# Patient Record
Sex: Female | Born: 1950 | Race: White | Hispanic: No | Smoking: Never smoker
Health system: Southern US, Community
[De-identification: ages and names within clinical notes are randomized; demographics above are authoritative.]

## PROBLEM LIST (undated history)

## (undated) DIAGNOSIS — E039 Hypothyroidism, unspecified: Secondary | ICD-10-CM

## (undated) DIAGNOSIS — R112 Nausea with vomiting, unspecified: Secondary | ICD-10-CM

## (undated) DIAGNOSIS — Z923 Personal history of irradiation: Secondary | ICD-10-CM

## (undated) DIAGNOSIS — M810 Age-related osteoporosis without current pathological fracture: Secondary | ICD-10-CM

## (undated) DIAGNOSIS — C4491 Basal cell carcinoma of skin, unspecified: Secondary | ICD-10-CM

## (undated) DIAGNOSIS — C50919 Malignant neoplasm of unspecified site of unspecified female breast: Secondary | ICD-10-CM

## (undated) DIAGNOSIS — Z973 Presence of spectacles and contact lenses: Secondary | ICD-10-CM

## (undated) DIAGNOSIS — Z9889 Other specified postprocedural states: Secondary | ICD-10-CM

## (undated) DIAGNOSIS — M1990 Unspecified osteoarthritis, unspecified site: Secondary | ICD-10-CM

## (undated) HISTORY — DX: Unspecified osteoarthritis, unspecified site: M19.90

## (undated) HISTORY — PX: TONSILLECTOMY: SUR1361

## (undated) HISTORY — DX: Hypothyroidism, unspecified: E03.9

## (undated) HISTORY — DX: Age-related osteoporosis without current pathological fracture: M81.0

## (undated) HISTORY — DX: Basal cell carcinoma of skin, unspecified: C44.91

## (undated) HISTORY — DX: Malignant neoplasm of unspecified site of unspecified female breast: C50.919

## (undated) HISTORY — PX: COLONOSCOPY: SHX174

---

## 1996-02-07 HISTORY — PX: ABDOMINAL HYSTERECTOMY: SHX81

## 2000-02-07 HISTORY — PX: APPENDECTOMY: SHX54

## 2000-05-29 ENCOUNTER — Encounter (INDEPENDENT_AMBULATORY_CARE_PROVIDER_SITE_OTHER): Payer: Self-pay | Admitting: Specialist

## 2000-05-29 ENCOUNTER — Observation Stay (HOSPITAL_COMMUNITY): Admission: EM | Admit: 2000-05-29 | Discharge: 2000-05-30 | Payer: Self-pay

## 2001-10-01 ENCOUNTER — Other Ambulatory Visit: Admission: RE | Admit: 2001-10-01 | Discharge: 2001-10-01 | Payer: Self-pay | Admitting: Internal Medicine

## 2005-10-18 ENCOUNTER — Encounter: Admission: RE | Admit: 2005-10-18 | Discharge: 2005-10-18 | Payer: Self-pay | Admitting: Internal Medicine

## 2006-10-22 ENCOUNTER — Encounter: Admission: RE | Admit: 2006-10-22 | Discharge: 2006-10-22 | Payer: Self-pay | Admitting: Internal Medicine

## 2007-10-28 ENCOUNTER — Encounter: Admission: RE | Admit: 2007-10-28 | Discharge: 2007-10-28 | Payer: Self-pay | Admitting: Internal Medicine

## 2008-10-28 ENCOUNTER — Encounter: Admission: RE | Admit: 2008-10-28 | Discharge: 2008-10-28 | Payer: Self-pay | Admitting: Internal Medicine

## 2009-10-29 ENCOUNTER — Encounter: Admission: RE | Admit: 2009-10-29 | Discharge: 2009-10-29 | Payer: Self-pay | Admitting: Internal Medicine

## 2010-06-24 NOTE — Op Note (Signed)
[facility]  Patient:    Ana Craig, Ana Craig                       MRN: [id number] Proc. Date: 05/29/00 Adm. Date:  [id number] Disc. Date: [id number] Attending:  Meredith Leeds                           Operative Report  PREOPERATIVE DIAGNOSIS:  Acute appendicitis.  POSTOPERATIVE DIAGNOSIS:  Acute appendicitis.  OPERATION PERFORMED:  Laparoscopic appendectomy.  SURGEON:  Lebron Conners, M.D.  ANESTHESIA:  General.  DESCRIPTION OF PROCEDURE:  After the patient had adequate monitoring and general anesthesia and routine preparation and draping of the abdomen, I made a short transverse infraumbilical incision after anesthetizing the area with long acting local anesthetic. I dissected down to the fascia, opened it longitudinally in the midline, opened the peritoneum bluntly and got into the abdomen easily and put my finger in to assure that there were no adhesions in the area. I put in a #0 Vicryl pursestring suture, secured a Hasson cannula, and inflated the abdomen with CO2. I could see some fluid in the pelvis and in the right lower quadrant when I put in the laparoscope. The gallbladder looked normal. The small bowel and cecum were normal. There were adhesions to the anterior midline of omentum and I took those down bluntly and with cautery cutting after the cautery. I then had good vision into the right lower quadrant. Placing the patient head down and tilted to the left and pulling up on the cecum with a Glassman retractor, I saw that there were adhesions. I took down those adhesions and then found the retrocecal appendix. I grasped it and elevated it and was able to see the mesoappendix. I put in a left lower quadrant 12 mm port and dissected away further adhesions and then stapled the mesoappendix with an endovascular stapler. I then stapled the base of the appendix with a similar stapler and there was good hemostasis and secure closure. I  removed the appendix from the body in a plastic pouch through the umbilical incision and then tied the pursestring suture. I irrigated out a small amount of blood which was present in the area of dissection and irrigated out the pelvic fluid. Hemostasis again appeared to be good. Sponge, needle and instrument counts were correct. There were no evident injuries of the bowel or other areas. I removed the right upper quadrant port under direct vision and then released the CO2 and removed the left lower quadrant port. I closed the skin of all the incisions with intracuticular 4-0 Vicryl and Steri-Strips. The patient tolerated the operation well. DD:  05/29/00 TD:  05/30/00 Job: [id number]

## 2010-09-30 ENCOUNTER — Other Ambulatory Visit: Payer: Self-pay | Admitting: Internal Medicine

## 2010-09-30 DIAGNOSIS — Z1231 Encounter for screening mammogram for malignant neoplasm of breast: Secondary | ICD-10-CM

## 2010-10-31 ENCOUNTER — Ambulatory Visit
Admission: RE | Admit: 2010-10-31 | Discharge: 2010-10-31 | Disposition: A | Discharge status: Home / Self Care | Payer: 59 | Source: Ambulatory Visit | Attending: Internal Medicine | Admitting: Internal Medicine

## 2010-10-31 DIAGNOSIS — Z1231 Encounter for screening mammogram for malignant neoplasm of breast: Secondary | ICD-10-CM

## 2011-09-18 ENCOUNTER — Other Ambulatory Visit: Payer: Self-pay | Admitting: Internal Medicine

## 2011-09-18 DIAGNOSIS — Z1231 Encounter for screening mammogram for malignant neoplasm of breast: Secondary | ICD-10-CM

## 2011-11-01 ENCOUNTER — Ambulatory Visit
Admission: RE | Admit: 2011-11-01 | Discharge: 2011-11-01 | Disposition: A | Discharge status: Home / Self Care | Payer: 59 | Source: Ambulatory Visit | Attending: Internal Medicine | Admitting: Internal Medicine

## 2011-11-01 DIAGNOSIS — Z1231 Encounter for screening mammogram for malignant neoplasm of breast: Secondary | ICD-10-CM

## 2012-02-07 DIAGNOSIS — Z923 Personal history of irradiation: Secondary | ICD-10-CM

## 2012-02-07 HISTORY — PX: BREAST LUMPECTOMY: SHX2

## 2012-02-07 HISTORY — DX: Personal history of irradiation: Z92.3

## 2012-10-17 ENCOUNTER — Other Ambulatory Visit: Payer: Self-pay

## 2012-10-17 DIAGNOSIS — Z1231 Encounter for screening mammogram for malignant neoplasm of breast: Secondary | ICD-10-CM

## 2012-11-21 ENCOUNTER — Ambulatory Visit
Admission: RE | Admit: 2012-11-21 | Discharge: 2012-11-21 | Disposition: A | Discharge status: Home / Self Care | Payer: 59 | Source: Ambulatory Visit

## 2012-11-21 ENCOUNTER — Ambulatory Visit: Payer: 59

## 2012-11-21 DIAGNOSIS — Z1231 Encounter for screening mammogram for malignant neoplasm of breast: Secondary | ICD-10-CM

## 2012-11-27 ENCOUNTER — Other Ambulatory Visit: Payer: Self-pay | Admitting: Internal Medicine

## 2012-11-27 DIAGNOSIS — R928 Other abnormal and inconclusive findings on diagnostic imaging of breast: Secondary | ICD-10-CM

## 2012-12-12 ENCOUNTER — Ambulatory Visit
Admission: RE | Admit: 2012-12-12 | Discharge: 2012-12-12 | Disposition: A | Discharge status: Home / Self Care | Payer: 59 | Source: Ambulatory Visit | Attending: Internal Medicine | Admitting: Internal Medicine

## 2012-12-12 ENCOUNTER — Other Ambulatory Visit: Payer: Self-pay | Admitting: Internal Medicine

## 2012-12-12 DIAGNOSIS — R921 Mammographic calcification found on diagnostic imaging of breast: Secondary | ICD-10-CM

## 2012-12-12 DIAGNOSIS — R928 Other abnormal and inconclusive findings on diagnostic imaging of breast: Secondary | ICD-10-CM

## 2012-12-13 ENCOUNTER — Ambulatory Visit
Admission: RE | Admit: 2012-12-13 | Discharge: 2012-12-13 | Disposition: A | Discharge status: Home / Self Care | Payer: 59 | Source: Ambulatory Visit | Attending: Internal Medicine | Admitting: Internal Medicine

## 2012-12-13 ENCOUNTER — Other Ambulatory Visit: Payer: Self-pay | Admitting: Internal Medicine

## 2012-12-13 DIAGNOSIS — R921 Mammographic calcification found on diagnostic imaging of breast: Secondary | ICD-10-CM

## 2012-12-13 DIAGNOSIS — D0511 Intraductal carcinoma in situ of right breast: Secondary | ICD-10-CM

## 2012-12-13 HISTORY — PX: BREAST BIOPSY: SHX20

## 2012-12-16 ENCOUNTER — Other Ambulatory Visit: Payer: Self-pay | Admitting: *Deleted

## 2012-12-16 ENCOUNTER — Telehealth: Payer: Self-pay | Admitting: *Deleted

## 2012-12-16 DIAGNOSIS — C50811 Malignant neoplasm of overlapping sites of right female breast: Secondary | ICD-10-CM

## 2012-12-16 DIAGNOSIS — Z853 Personal history of malignant neoplasm of breast: Secondary | ICD-10-CM | POA: Insufficient documentation

## 2012-12-16 NOTE — Telephone Encounter (Signed)
Confirmed [facility] for 12/18/12 at 0800 .  Instructions and contact information given.

## 2012-12-18 ENCOUNTER — Encounter: Payer: Self-pay | Admitting: *Deleted

## 2012-12-18 ENCOUNTER — Ambulatory Visit
Admission: RE | Admit: 2012-12-18 | Discharge: 2012-12-18 | Disposition: A | Discharge status: Home / Self Care | Payer: 59 | Source: Ambulatory Visit | Attending: Radiation Oncology | Admitting: Radiation Oncology

## 2012-12-18 ENCOUNTER — Ambulatory Visit (HOSPITAL_BASED_OUTPATIENT_CLINIC_OR_DEPARTMENT_OTHER): Payer: 59 | Admitting: Oncology

## 2012-12-18 ENCOUNTER — Encounter: Payer: Self-pay | Admitting: Oncology

## 2012-12-18 ENCOUNTER — Ambulatory Visit (HOSPITAL_BASED_OUTPATIENT_CLINIC_OR_DEPARTMENT_OTHER): Payer: 59 | Admitting: General Surgery

## 2012-12-18 ENCOUNTER — Other Ambulatory Visit (HOSPITAL_BASED_OUTPATIENT_CLINIC_OR_DEPARTMENT_OTHER): Payer: 59 | Admitting: Lab

## 2012-12-18 ENCOUNTER — Ambulatory Visit: Payer: 59

## 2012-12-18 ENCOUNTER — Ambulatory Visit: Payer: 59 | Admitting: Physical Therapy

## 2012-12-18 VITALS — BP 123/78 | HR 91 | Temp 97.6°F | Resp 20 | Ht 67.0 in | Wt 126.6 lb

## 2012-12-18 DIAGNOSIS — C50811 Malignant neoplasm of overlapping sites of right female breast: Secondary | ICD-10-CM

## 2012-12-18 DIAGNOSIS — D0590 Unspecified type of carcinoma in situ of unspecified breast: Secondary | ICD-10-CM

## 2012-12-18 DIAGNOSIS — Z170 Estrogen receptor positive status [ER+]: Secondary | ICD-10-CM

## 2012-12-18 DIAGNOSIS — C50919 Malignant neoplasm of unspecified site of unspecified female breast: Secondary | ICD-10-CM

## 2012-12-18 DIAGNOSIS — M810 Age-related osteoporosis without current pathological fracture: Secondary | ICD-10-CM

## 2012-12-18 LAB — COMPREHENSIVE METABOLIC PANEL (CC13)
Albumin: 4.1 g/dL (ref 3.5–5.0)
Alkaline Phosphatase: 84 U/L (ref 40–150)
Anion Gap: 9 mEq/L (ref 3–11)
BUN: 14.4 mg/dL (ref 7.0–26.0)
CO2: 28 mEq/L (ref 22–29)
Glucose: 66 mg/dl — ABNORMAL LOW (ref 70–140)
Potassium: 4.3 mEq/L (ref 3.5–5.1)
Sodium: 142 mEq/L (ref 136–145)
Total Bilirubin: 1.12 mg/dL (ref 0.20–1.20)
Total Protein: 7.8 g/dL (ref 6.4–8.3)

## 2012-12-18 LAB — CBC WITH DIFFERENTIAL/PLATELET
Basophils Absolute: 0.1 10*3/uL (ref 0.0–0.1)
Eosinophils Absolute: 0.1 10*3/uL (ref 0.0–0.5)
HGB: 14 g/dL (ref 11.6–15.9)
LYMPH%: 23.8 % (ref 14.0–49.7)
MCV: 92 fL (ref 79.5–101.0)
MONO#: 0.5 10*3/uL (ref 0.1–0.9)
MONO%: 10 % (ref 0.0–14.0)
NEUT#: 3.3 10*3/uL (ref 1.5–6.5)
Platelets: 297 10*3/uL (ref 145–400)
RBC: 4.54 10*6/uL (ref 3.70–5.45)
WBC: 5.3 10*3/uL (ref 3.9–10.3)

## 2012-12-18 NOTE — Progress Notes (Signed)
Chief Complaint: New diagnosis of breast cancer  History:    Ana Craig is a 62 y.o. postmenopausal female referred by Dr. 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 She recently presented for a screening mamogram revealing a small area of calcifications and possible mass in the right breast..  Subsequent imaging included diagnostic mamogram showing a 5 mm area of grouped calcifications in the upper central portion of the right breast. 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 She is seen now in [facility] for initial treatment planning.  She has experienced no breast symptoms, specifically no lump, pain, nipple discharge or skin changes..  She does not have a personal history of any previous breast problems. Bilateral breast MRI and has been scheduled for later this week.  Findings at that time were the following:  Tumor size: 5mm cm  Tumor grade: 3  Estrogen Receptor: positive Progesterone Receptor: negative   Past Medical History  Diagnosis Date  . Breast cancer   . Osteoporosis   . Hypothyroidism   . Arthritis     Past Surgical History  Procedure Laterality Date  . Abdominal hysterectomy    . Appendectomy      Current Outpatient Prescriptions  Medication Sig Dispense Refill  . levothyroxine (SYNTHROID, LEVOTHROID) 75 MCG tablet 75 mcg daily.       No current facility-administered medications for this visit.    No family history on file.  History   Social History  . Marital Status: Married    Spouse Name: N/A    Number of Children: N/A  . Years of Education: N/A   Social History Main Topics  . Smoking status: Never Smoker   . Smokeless tobacco: Not on file  . Alcohol Use: No  . Drug Use: No  . Sexual Activity: Not on file   Other Topics Concern  . Not on file   Social History Narrative  . No narrative on file     Review of Systems Constitutional:  negative Respiratory: negative Cardiovascular: negative Gastrointestinal: negative, colonoscopy is up-to-date     Objective:  There were no vitals taken for this visit.  General: Alert, well-developed thin Caucasian female, in no distress Skin: Warm and dry without rash or infection. HEENT: No palpable masses or thyromegaly. Sclera nonicteric. Pupils equal round and reactive. Oropharynx clear. Breasts: healing biopsy site and minimal bruising in the upper central right breast. No palpable masses in either breast. No skin changes. No palpable axillary adenopathy. Lymph nodes: No cervical, supraclavicular, or inguinal nodes palpable. Lungs: Breath sounds clear and equal without increased work of breathing Cardiovascular: Regular rate and rhythm without murmur. No JVD or edema. Peripheral pulses intact. Abdomen: Nondistended. Soft and nontender. No masses palpable. No organomegaly. No palpable hernias. Extremities: No edema or joint swelling or deformity. No chronic venous stasis changes. Neurologic: Alert and fully oriented. Gait normal.   Laboratory data:  CBC:  Lab Results  Component Value Date   WBC 5.3 12/18/2012   RBC 4.54 12/18/2012   HGB 14.0 12/18/2012   HCT 41.8 12/18/2012   PLT 297 12/18/2012  ]  [facility] Labs:  Lab Results  Component Value Date   NA 142 12/18/2012   K 4.3 12/18/2012   CO2 28 12/18/2012   BUN 14.4 12/18/2012   CREATININE 1.0 12/18/2012   CALCIUM 10.1 12/18/2012   PROT 7.8 12/18/2012   BILITOT 1.12 12/18/2012   ALKPHOS 84 12/18/2012  AST 26 12/18/2012   ALT 25 12/18/2012     Assessment  62 y.o. female with a new diagnosis of cancer of the the right breast upper central.  Clinical 0, estrogen receptor positive. I discussed with the patient and her friend present today initial surgical treatment options. We discussed options of breast conservation with lumpectomy or total mastectomy and sentinal lymph node biopsy/dissection. Options for reconstruction  were discussed. After discussion they have elected to proceed with right partial mastectomy.  We discussed the indications and nature of the procedure, and expected recovery, in detail. Surgical risks including anesthetic complications, cardiorespiratory complications, bleeding, infection, wound healing complications, blood clots, lymphedema, local and distant recurrence and possible need for further surgery based on the final pathology was discussed and understood.  Chemotherapy, hormonal therapy and radiation therapy have been discussed. They have been provided with literature regarding the treatment of breast cancer.  All questions were answered. They understand and agree to proceed and we will go ahead with scheduling.  Plan right partial mastectomy, needle localized as an outpatient under general anesthesia. This is of course pending the results of her MRI scheduled for later this week.  Mariella Saa MD, FACS  12/18/2012, 10:19 AM

## 2012-12-18 NOTE — Progress Notes (Signed)
Yasira Engelson [id number] 12/12/1950 62 y.o. 12/18/2012 12:31 PM  CC Dr. Rene Paci Dr. Glenna Fellows Dr. Lurline Hare  REASON FOR CONSULTATION:  62 year old female with new diagnosis of right breast cancer (DCIS) patient is seen in the multidisciplinary breast clinic for discussion of treatment options.  STAGE:   Cancer of midline of female breast   Primary site: Breast (Right)   Staging method: AJCC 7th Edition   Clinical: Stage 0 (Tis (DCIS), N0, cM0)   Summary: Stage 0 (Tis (DCIS), N0, cM0)  REFERRING PHYSICIAN: Dr. Sharlet Salina Hoxworth  HISTORY OF PRESENT ILLNESS:  Tosca Pletz is a 62 y.o. female.  Would medical history significant for osteoporosis and hypothyroidism and arthritis. Patient underwent bilateral screening mammogram on 11/21/2012. The mammogram revealed calcifications and a possible mass. She was recommended further evaluation of the right breast. In the left breast no masses or malignant type calcifications are identified. On 12/12/2012 patient had a right diagnostic mammogram performed this did not show a definite nodule in the central aspect of the right breast on magnification views there was noted to be group of calcifications in the upper central portion of the breasts. These measure 5 mm in diameter. Because of this she was recommended ultrasound which showed normal appearing common dense fibroglandular tissue no mass or distortion or acoustic shadowing. 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 Tumor was ER positive PR negative. Her MRI is pending. Patient is without any complaints related to her breast. Her case was discussed at the multidisciplinary breast conference this morning and she is now seen in the multidisciplinary breast clinic for discussion of further treatment options. She was also seen by Dr. Hal Neer and Dr. Glenna Fellows.     Past Medical History: Past Medical History  Diagnosis Date  . Breast cancer   . Osteoporosis   . Hypothyroidism   . Arthritis     Past Surgical History: Past Surgical History  Procedure Laterality Date  . Abdominal hysterectomy    . Appendectomy      Family History: History reviewed. No pertinent family history.  Social History History  Substance Use Topics  . Smoking status: Never Smoker   . Smokeless tobacco: Not on file  . Alcohol Use: No    Allergies: Allergies  Allergen Reactions  . Penicillins Rash    Current Medications: Current Outpatient Prescriptions  Medication Sig Dispense Refill  . levothyroxine (SYNTHROID, LEVOTHROID) 75 MCG tablet 75 mcg daily.       No current facility-administered medications for this visit.    OB/GYN History: menarche at age 69 patient underwent menopause she did go on hormone replacement therapy she stopped in 1999 she is nulliparous area in she has had use of oral contraceptive pills.  Fertility Discussion:not applicable  Prior History of Cancer: no  Health Maintenance:  Colonoscopy yes  Bone Density yes Last PAP smear unknown   ECOG PERFORMANCE STATUS: 0 - Asymptomatic  Genetic Counseling/testing: no   REVIEW OF SYSTEMS:  A comprehensive review of systems was negative.  PHYSICAL EXAMINATION: Blood pressure 123/78, pulse 91, temperature 97.6 F (36.4 C), temperature source Oral, resp. rate 20, height 5\' 7"  (1.702 m), weight 126 lb 9.6 oz (57.425 kg).  WUJ:WJXBJ, healthy, no distress, well nourished and well developed SKIN: skin color, texture, turgor are normal HEAD: Normocephalic EYES: normal, PERRLA, EOMI, Conjunctiva are pink and non-injected EARS: External ears normal OROPHARYNX:no exudate, no erythema and lips, buccal  mucosa, and tongue normal  NECK: supple, no adenopathy, thyroid normal size, non-tender, without nodularity LYMPH:  no palpable lymphadenopathy, no hepatosplenomegaly BREAST:left  breast normal without mass, skin or nipple changes or axillary nodes, area of ecchymosis is noted in the right breast with thickness of the biopsy site no other palpable masses nipple discharge or other skin changes  LUNGS: clear to auscultation and percussion HEART: regular rate & rhythm, no murmurs and no gallops ABDOMEN:abdomen soft, non-tender, normal bowel sounds and no masses or organomegaly BACK: Back symmetric, no curvature., No CVA tenderness, Range of motion is normal EXTREMITIES:no edema, no clubbing, no cyanosis  NEURO: alert & oriented x 3 with fluent speech, no focal motor/sensory deficits, gait normal, reflexes normal and symmetric     STUDIES/RESULTS: US Breast Right  12/12/2012   CLINICAL DATA:  The patient presents for further evaluation of right breast calcifications and a possible right breast mass.  EXAM: DIGITAL DIAGNOSTIC  right MAMMOGRAM  ULTRASOUND right BREAST  COMPARISON:  11/21/2012  ACR Breast Density Category c: The breasts are heterogeneously dense, which may obscure small masses.  FINDINGS: Additional views are performed, showing no definite nodule in the central aspect of the right breast on spot compression view. On magnified views, there is a grouping of calcifications in the upper central portion of the breast. On magnified views, this grouping measures approximately 5 mm in diameter. Calcifications vary in size, shape, and density. Biopsy is recommended to exclude ductal carcinoma in situ.  On physical exam I palpate no abnormality in the central aspect of the right breast.  Ultrasound is performed, showing normal appearing common dense fibroglandular tissue in the retroareolar region of the right breast. No mass, distortion, or acoustic shadowing is demonstrated with ultrasound.  IMPRESSION: 1. Suspicious microcalcifications in the upper central portion of the right breast. 2. No persistent mass on further evaluation.  RECOMMENDATION: Stereotactic guided core biopsy  is recommended for right breast calcifications. This will be performed later today at the request of the patient.  I have discussed the findings and recommendations with the patient. Results were also provided in writing at the conclusion of the visit. If applicable, a reminder letter will be sent to the patient regarding the next appointment.  BI-RADS CATEGORY  4: Suspicious abnormality - biopsy should be considered.   Electronically Signed   By: Rosalie Gums M.D.   On: 12/12/2012 11:49   Mm Digital Diag Ltd R  12/12/2012   CLINICAL DATA:  The patient presents for further evaluation of right breast calcifications and a possible right breast mass.  EXAM: DIGITAL DIAGNOSTIC  right MAMMOGRAM  ULTRASOUND right BREAST  COMPARISON:  11/21/2012  ACR Breast Density Category c: The breasts are heterogeneously dense, which may obscure small masses.  FINDINGS: Additional views are performed, showing no definite nodule in the central aspect of the right breast on spot compression view. On magnified views, there is a grouping of calcifications in the upper central portion of the breast. On magnified views, this grouping measures approximately 5 mm in diameter. Calcifications vary in size, shape, and density. Biopsy is recommended to exclude ductal carcinoma in situ.  On physical exam I palpate no abnormality in the central aspect of the right breast.  Ultrasound is performed, showing normal appearing common dense fibroglandular tissue in the retroareolar region of the right breast. No mass, distortion, or acoustic shadowing is demonstrated with ultrasound.  IMPRESSION: 1. Suspicious microcalcifications in the upper central portion of the right breast. 2. No  persistent mass on further evaluation.  RECOMMENDATION: Stereotactic guided core biopsy is recommended for right breast calcifications. This will be performed later today at the request of the patient.  I have discussed the findings and recommendations with the patient.  Results were also provided in writing at the conclusion of the visit. If applicable, a reminder letter will be sent to the patient regarding the next appointment.  BI-RADS CATEGORY  4: Suspicious abnormality - biopsy should be considered.   Electronically Signed   By: Rosalie Gums M.D.   On: 12/12/2012 11:49   Mm Digital Screening  11/21/2012   CLINICAL DATA:  Screening.  EXAM: DIGITAL SCREENING BILATERAL MAMMOGRAM WITH CAD  DIGITAL BREAST TOMOSYNTHESIS  Digital breast tomosynthesis images are acquired in two projections. These images are reviewed in combination with the digital mammogram, confirming the findings below.  COMPARISON:  Previous Exam(s)  ACR Breast Density Category c: The breasts are heterogeneously dense, which may obscure small masses.  FINDINGS: In the right breast, calcifications and a possible mass warrant further evaluation with magnified views. In the left breast, no mass or malignant type calcifications are identified. Images were processed with CAD.  IMPRESSION: Further evaluation is suggested for calcifications in the right breast.  RECOMMENDATION: Diagnostic mammogram and possibly ultrasound of the right breast. (Code:[id number]-R-65M)  The patient will be contacted regarding the findings, and additional imaging will be scheduled.  BI-RADS CATEGORY  0: Incomplete. Need additional imaging evaluation and/or prior mammograms for comparison.   Electronically Signed   By: Esperanza Heir M.D.   On: 11/21/2012 15:05   Mm Radiologist Eval And Mgmt  12/18/2012   CONSULTATION: HPI: Ms. Larina Bras returns for a followup after a right breast stereotactic biopsy 12/12/2012. The patient is doing well and denies any biopsy site complications.  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 This is concordant with the imaging findings.  Biopsy site: Clean, dry, and intact. Steri-Strips overlie small skin incision.  PLAN: All the patient's questions were answered. She  is aware of the results and demonstrates understanding. The patient is scheduled to follow-up in Multidisciplinary conference 12/18/2012 and a breast MRI is scheduled on 12/20/2012.   Electronically Signed   By: Edwin Cap M.D.   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 This is concordant with the imaging findings.  The patient is aware of the results. She denies any biopsy site complications.  Ms. Nedra Hai is scheduled to follow-up in Multidisciplinary conference on 12/18/2012 and a breast MRI is scheduled on 12/20/2012.   Electronically Signed   By: Edwin Cap M.D.   On: 12/13/2012 16:12   12/13/2012   CLINICAL DATA:  Screening. The patient presents for stereotactic guided core biopsy of calcifications in the upper central portion of the right breast.  EXAM: STEREOTACTIC VACUUM ASSIST RIGHT  COMPARISON:  11/21/2012, 12/12/2012  FINDINGS: I met with the patient and we discussed the procedure of stereotactic-guided biopsy, including benefits and alternatives. We discussed the high likelihood of a successful procedure. We discussed the risks of the procedure, including infection, bleeding, tissue injury, clip migration, and inadequate sampling. Informed, written consent was given. The usual time out protocol was performed immediately prior to the procedure.  Using sterile technique and 2% Lidocaine as local anesthetic, under stereotactic guidance, a 12  gauge vacuum assisted device was used to perform core needle biopsy of calcifications in the upper central portion of the right breast using a cephalad approach. Specimen radiograph was performed, showing calcifications to be present. Specimens with calcifications are identified for pathology.  At the conclusion of the procedure, a top hat shaped tissue marker clip  was deployed into the biopsy cavity. Follow-up 2-view mammogram confirmed clipin expected location.  IMPRESSION: Stereotactic-guided biopsy of right breast calcifications. No apparent complications.  Electronically Signed: By: Rosalie Gums M.D. On: 12/12/2012 16:37     LABS:    Chemistry      Component Value Date/Time   NA 142 12/18/2012 0827   K 4.3 12/18/2012 0827   CO2 28 12/18/2012 0827   BUN 14.4 12/18/2012 0827   CREATININE 1.0 12/18/2012 0827      Component Value Date/Time   CALCIUM 10.1 12/18/2012 0827   ALKPHOS 84 12/18/2012 0827   AST 26 12/18/2012 0827   ALT 25 12/18/2012 0827   BILITOT 1.12 12/18/2012 0827      Lab Results  Component Value Date   WBC 5.3 12/18/2012   HGB 14.0 12/18/2012   HCT 41.8 12/18/2012   MCV 92.0 12/18/2012   PLT 297 12/18/2012   PATHOLOGY: Diagnosis Breast, right, needle core biopsy, upper central right breast - DUCTAL CARCINOMA IN SITU WITH CALCIFICATIONS. - SEE COMMENT. Microscopic Comment The carcinoma is high grade. Estrogen receptor and progesterone receptor studies will be performed and the results reported separately. The results are called to [facility] on 12/13/12. 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 No evidence of any disease in the left breast. Patient is status post stereotactic biopsy. Is seen in the multidisciplinary breast clinic for discussion of treatment options.  #2 We spent the better part of today's hour-long appointment discussing the biology of breast cancer in general, and the specifics of the patient's tumor in particular. We discussed the pathophysiology of DCIS, a prognostic panel, we discussed  treatment options surgically for this patient she is a good breast conservation candidate with a lumpectomy. She most likely will not need sentinel lymph node biopsy. We discussed adjuvant radiation therapy. We also discussed adjuvant antiestrogen therapy for reduction of future breast cancer risk recurrence for DCIS and invasive disease. We discussed tamoxifen 20 mg daily. We discussed side effects and benefits of tamoxifen. We discussed length of therapy would be 5 years.   Clinical Trial Eligibility: no Multidisciplinary conference discussion yes     PLAN:    #1 patient will proceed with lumpectomy first.  #2 once patient completes her surgery I will see the patient will go over her final pathology. If she has no significant changes in overall pathology and she still has a DCIS I will refer her to Dr. Lurline Hare for consideration of radiation therapy adjuvantly area  #3 once patient completes radiation she will receive adjuvant tamoxifen 20 mg daily for a total of 5 years.  #4 patient will be seen back's after her surgery        Discussion: Patient is being treated per NCCN breast cancer care guidelines appropriate for stage.0   Thank you so much for allowing me to participate in the care of Ansel Bong.  I will continue to follow up the patient with you and assist in her care.  All questions were answered. The patient knows to call the clinic with any problems, questions or concerns. We can certainly see the patient much sooner if necessary.  I spent 40 minutes counseling the patient face to face. The total time spent in the appointment was 60 minutes.  Drue Second, MD Medical/Oncology [facility] [phone] (beeper) [phone] (Office)  12/18/2012, 12:31 PM

## 2012-12-18 NOTE — Progress Notes (Signed)
Checked in new patient with no financial issues. She has not been to [place] and she has her appt card. She has her [facility] packet also.

## 2012-12-18 NOTE — Progress Notes (Signed)
Radiation Oncology         [phone]) [phone] ________________________________  Initial outpatient Consultation - Date: 12/18/2012   Name: Ana Craig MRN: [id number]   DOB: 31-Oct-1950  REFERRING PHYSICIAN: Mariella Saa, MD  DIAGNOSIS: DCIS of the right breast  HISTORY OF PRESENT ILLNESS::Ana Craig is a 62 y.o. female  underwent a screening mammogram and was found to have calcifications on the right. She had no symptoms and no history of cancer. A 5 mm area of calcifications was noted. 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 This was ER positive at 15% PR negative. She has had a hysterectomy but her ovaries were left behind. She was on hormone replacement for about 5 years in 1990s. She is not on hormone replacement now. He is GX P 0. She had menses at 16. She is accompanied by her good friend today. 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 She would like to receive her radiation in [place]. He is interested in breast conservation. Her MRI is scheduled for Friday.  PREVIOUS RADIATION THERAPY: No  PAST MEDICAL HISTORY:  has a past medical history of Breast cancer; Osteoporosis; Hypothyroidism; and Arthritis.    PAST SURGICAL HISTORY: Past Surgical History  Procedure Laterality Date  . Abdominal hysterectomy    . Appendectomy      FAMILY HISTORY: No family history on file.  SOCIAL HISTORY:  History  Substance Use Topics  . Smoking status: Never Smoker   . Smokeless tobacco: Not on file  . Alcohol Use: No    ALLERGIES: Penicillins  MEDICATIONS:  Current Outpatient Prescriptions  Medication Sig Dispense Refill  . levothyroxine (SYNTHROID, LEVOTHROID) 75 MCG tablet 75 mcg daily.       No current facility-administered medications for this encounter.    REVIEW OF SYSTEMS:  A 15 point review of systems is documented in the electronic medical record. This was obtained by the nursing  staff. However, I reviewed this with the patient to discuss relevant findings and make appropriate changes.  Pertinent items are noted in HPI.  PHYSICAL EXAM:  She is a pleasant female in no distress sitting comfortably on examining table. She has minimal biopsy change in the upper outer quadrant of the right breast. No palpable abnormalities bilaterally. No palpable cervical or subclavicular adenopathy. No palpable axillary adenopathy bilaterally. She is alert and oriented x3. She has no lymphedema in her bilateral arms. She is 5 out of 5 strength bilaterally.  LABORATORY DATA:  Lab Results  Component Value Date   WBC 5.3 12/18/2012   HGB 14.0 12/18/2012   HCT 41.8 12/18/2012   MCV 92.0 12/18/2012   PLT 297 12/18/2012   Lab Results  Component Value Date   NA 142 12/18/2012   K 4.3 12/18/2012   CO2 28 12/18/2012   Lab Results  Component Value Date   ALT 25 12/18/2012   AST 26 12/18/2012   ALKPHOS 84 12/18/2012   BILITOT 1.12 12/18/2012     RADIOGRAPHY: US Breast Right  12/12/2012   CLINICAL DATA:  The patient presents for further evaluation of right breast calcifications and a possible right breast mass.  EXAM: DIGITAL DIAGNOSTIC  right MAMMOGRAM  ULTRASOUND right BREAST  COMPARISON:  11/21/2012  ACR Breast Density Category c: The breasts are heterogeneously dense, which may obscure small masses.  FINDINGS: Additional views are performed, showing no definite nodule in the central aspect of the right breast on spot compression view.  On magnified views, there is a grouping of calcifications in the upper central portion of the breast. On magnified views, this grouping measures approximately 5 mm in diameter. Calcifications vary in size, shape, and density. Biopsy is recommended to exclude ductal carcinoma in situ.  On physical exam I palpate no abnormality in the central aspect of the right breast.  Ultrasound is performed, showing normal appearing common dense fibroglandular tissue in the  retroareolar region of the right breast. No mass, distortion, or acoustic shadowing is demonstrated with ultrasound.  IMPRESSION: 1. Suspicious microcalcifications in the upper central portion of the right breast. 2. No persistent mass on further evaluation.  RECOMMENDATION: Stereotactic guided core biopsy is recommended for right breast calcifications. This will be performed later today at the request of the patient.  I have discussed the findings and recommendations with the patient. Results were also provided in writing at the conclusion of the visit. If applicable, a reminder letter will be sent to the patient regarding the next appointment.  BI-RADS CATEGORY  4: Suspicious abnormality - biopsy should be considered.   Electronically Signed   By: Rosalie Gums M.D.   On: 12/12/2012 11:49   Mm Digital Diag Ltd R  12/12/2012   CLINICAL DATA:  The patient presents for further evaluation of right breast calcifications and a possible right breast mass.  EXAM: DIGITAL DIAGNOSTIC  right MAMMOGRAM  ULTRASOUND right BREAST  COMPARISON:  11/21/2012  ACR Breast Density Category c: The breasts are heterogeneously dense, which may obscure small masses.  FINDINGS: Additional views are performed, showing no definite nodule in the central aspect of the right breast on spot compression view. On magnified views, there is a grouping of calcifications in the upper central portion of the breast. On magnified views, this grouping measures approximately 5 mm in diameter. Calcifications vary in size, shape, and density. Biopsy is recommended to exclude ductal carcinoma in situ.  On physical exam I palpate no abnormality in the central aspect of the right breast.  Ultrasound is performed, showing normal appearing common dense fibroglandular tissue in the retroareolar region of the right breast. No mass, distortion, or acoustic shadowing is demonstrated with ultrasound.  IMPRESSION: 1. Suspicious microcalcifications in the upper central  portion of the right breast. 2. No persistent mass on further evaluation.  RECOMMENDATION: Stereotactic guided core biopsy is recommended for right breast calcifications. This will be performed later today at the request of the patient.  I have discussed the findings and recommendations with the patient. Results were also provided in writing at the conclusion of the visit. If applicable, a reminder letter will be sent to the patient regarding the next appointment.  BI-RADS CATEGORY  4: Suspicious abnormality - biopsy should be considered.   Electronically Signed   By: Rosalie Gums M.D.   On: 12/12/2012 11:49   Mm Digital Screening  11/21/2012   CLINICAL DATA:  Screening.  EXAM: DIGITAL SCREENING BILATERAL MAMMOGRAM WITH CAD  DIGITAL BREAST TOMOSYNTHESIS  Digital breast tomosynthesis images are acquired in two projections. These images are reviewed in combination with the digital mammogram, confirming the findings below.  COMPARISON:  Previous Exam(s)  ACR Breast Density Category c: The breasts are heterogeneously dense, which may obscure small masses.  FINDINGS: In the right breast, calcifications and a possible mass warrant further evaluation with magnified views. In the left breast, no mass or malignant type calcifications are identified. Images were processed with CAD.  IMPRESSION: Further evaluation is suggested for calcifications in the right  breast.  RECOMMENDATION: Diagnostic mammogram and possibly ultrasound of the right breast. (Code:[id number])  The patient will be contacted regarding the findings, and additional imaging will be scheduled.  BI-RADS CATEGORY  0: Incomplete. Need additional imaging evaluation and/or prior mammograms for comparison.   Electronically Signed   By: Esperanza Heir M.D.   On: 11/21/2012 15:05   Mm Radiologist Eval And Mgmt  12/18/2012   CONSULTATION: HPI: Ms. Larina Bras returns for a followup after a right breast stereotactic biopsy 12/12/2012. The patient is doing well and  denies any biopsy site complications.  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 This is concordant with the imaging findings.  Biopsy site: Clean, dry, and intact. Steri-Strips overlie small skin incision.  PLAN: All the patient's questions were answered. She is aware of the results and demonstrates understanding. The patient is scheduled to follow-up in Multidisciplinary conference 12/18/2012 and a breast MRI is scheduled on 12/20/2012.   Electronically Signed   By: Edwin Cap M.D.   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 This is concordant with the imaging findings.  The patient is aware of the results. She denies any biopsy site complications.  Ms. Nedra Hai is scheduled to follow-up in Multidisciplinary conference on 12/18/2012 and a breast MRI is scheduled on 12/20/2012.   Electronically Signed   By: Edwin Cap M.D.   On: 12/13/2012 16:12   12/13/2012   CLINICAL DATA:  Screening. The patient presents for stereotactic guided core biopsy of calcifications in the upper central portion of the right breast.  EXAM: STEREOTACTIC VACUUM ASSIST RIGHT  COMPARISON:  11/21/2012, 12/12/2012  FINDINGS: I met with the patient and we discussed the procedure of stereotactic-guided biopsy, including benefits and alternatives. We discussed the high likelihood of a successful procedure. We discussed the risks of the procedure, including infection, bleeding, tissue injury, clip migration, and inadequate sampling. Informed, written consent was given. The usual time out protocol was performed immediately prior to the procedure.  Using sterile technique and 2% Lidocaine as local anesthetic, under stereotactic guidance, a 12 gauge vacuum assisted device was used to perform core  needle biopsy of calcifications in the upper central portion of the right breast using a cephalad approach. Specimen radiograph was performed, showing calcifications to be present. Specimens with calcifications are identified for pathology.  At the conclusion of the procedure, a top hat shaped tissue marker clip was deployed into the biopsy cavity. Follow-up 2-view mammogram confirmed clipin expected location.  IMPRESSION: Stereotactic-guided biopsy of right breast calcifications. No apparent complications.  Electronically Signed: By: Rosalie Gums M.D. On: 12/12/2012 16:37      IMPRESSION: DCIS of the right breast  PLAN: I discussed with Ms. Nedra Hai that her MRI is pending. This could provide more information which could change her surgical options. At this point however she is a good candidate for breast conservation. We discussed the role of radiation and decreasing local failures in patients who undergo lumpectomy. We discussed the process of simulation the placement tattoos. We discussed 4-6 weeks of treatment as an outpatient. She would like to meet with me after surgery and then receive her radiation in [facility]. This will facilitate treatment before or after work for her. We discussed skin redness and asymptomatic lung damage as possible side effects.  I  spent 40 minutes  face to face with the patient and more than 50% of that time was spent in counseling and/or coordination of care.   ------------------------------------------------  Lurline Hare, MD

## 2012-12-19 ENCOUNTER — Encounter: Payer: Self-pay | Admitting: *Deleted

## 2012-12-19 ENCOUNTER — Encounter (HOSPITAL_BASED_OUTPATIENT_CLINIC_OR_DEPARTMENT_OTHER): Payer: Self-pay | Admitting: *Deleted

## 2012-12-19 NOTE — Progress Notes (Signed)
No PCP - Could not fax a Care Plan.

## 2012-12-19 NOTE — Progress Notes (Signed)
Faxed Care Plan to [facility] at [facility].  Took to Med Rec to scan.  

## 2012-12-19 NOTE — Progress Notes (Signed)
No labs needed-had labs cc

## 2012-12-20 ENCOUNTER — Ambulatory Visit
Admission: RE | Admit: 2012-12-20 | Discharge: 2012-12-20 | Disposition: A | Discharge status: Home / Self Care | Payer: 59 | Source: Ambulatory Visit | Attending: Internal Medicine | Admitting: Internal Medicine

## 2012-12-20 DIAGNOSIS — D0511 Intraductal carcinoma in situ of right breast: Secondary | ICD-10-CM

## 2012-12-20 MED ORDER — GADOBENATE DIMEGLUMINE 529 MG/ML IV SOLN
11.0000 mL | Freq: Once | INTRAVENOUS | Status: AC | PRN
  Administered 2012-12-20: 11 mL via INTRAVENOUS

## 2012-12-23 ENCOUNTER — Telehealth: Payer: Self-pay | Admitting: *Deleted

## 2012-12-23 NOTE — Telephone Encounter (Signed)
Spoke to pt concerning [facility] from 12/18/12.  Pt denies questions or concerns.  Confirmed surgery date.  Encourage pt to call with needs. Received verbal understanding.  Contact information given.

## 2012-12-25 ENCOUNTER — Ambulatory Visit (HOSPITAL_BASED_OUTPATIENT_CLINIC_OR_DEPARTMENT_OTHER)
Admission: RE | Admit: 2012-12-25 | Discharge: 2012-12-25 | Disposition: A | Discharge status: Home / Self Care | Payer: 59 | Source: Ambulatory Visit | Attending: General Surgery | Admitting: General Surgery

## 2012-12-25 ENCOUNTER — Encounter (HOSPITAL_BASED_OUTPATIENT_CLINIC_OR_DEPARTMENT_OTHER): Payer: 59 | Admitting: Anesthesiology

## 2012-12-25 ENCOUNTER — Encounter (HOSPITAL_BASED_OUTPATIENT_CLINIC_OR_DEPARTMENT_OTHER): Payer: Self-pay | Admitting: Anesthesiology

## 2012-12-25 ENCOUNTER — Encounter (HOSPITAL_BASED_OUTPATIENT_CLINIC_OR_DEPARTMENT_OTHER)
Admission: RE | Disposition: A | Discharge status: Home / Self Care | Payer: Self-pay | Source: Ambulatory Visit | Attending: General Surgery

## 2012-12-25 ENCOUNTER — Ambulatory Visit
Admission: RE | Admit: 2012-12-25 | Discharge: 2012-12-25 | Disposition: A | Discharge status: Home / Self Care | Payer: 59 | Source: Ambulatory Visit | Attending: General Surgery | Admitting: General Surgery

## 2012-12-25 ENCOUNTER — Encounter: Payer: Self-pay | Admitting: *Deleted

## 2012-12-25 ENCOUNTER — Ambulatory Visit (HOSPITAL_BASED_OUTPATIENT_CLINIC_OR_DEPARTMENT_OTHER): Payer: 59 | Admitting: Anesthesiology

## 2012-12-25 DIAGNOSIS — C50811 Malignant neoplasm of overlapping sites of right female breast: Secondary | ICD-10-CM

## 2012-12-25 DIAGNOSIS — Z780 Asymptomatic menopausal state: Secondary | ICD-10-CM | POA: Insufficient documentation

## 2012-12-25 DIAGNOSIS — E039 Hypothyroidism, unspecified: Secondary | ICD-10-CM | POA: Insufficient documentation

## 2012-12-25 DIAGNOSIS — Z170 Estrogen receptor positive status [ER+]: Secondary | ICD-10-CM | POA: Insufficient documentation

## 2012-12-25 DIAGNOSIS — D0590 Unspecified type of carcinoma in situ of unspecified breast: Secondary | ICD-10-CM

## 2012-12-25 DIAGNOSIS — C50919 Malignant neoplasm of unspecified site of unspecified female breast: Secondary | ICD-10-CM

## 2012-12-25 DIAGNOSIS — M810 Age-related osteoporosis without current pathological fracture: Secondary | ICD-10-CM | POA: Insufficient documentation

## 2012-12-25 HISTORY — DX: Malignant neoplasm of unspecified site of unspecified female breast: C50.919

## 2012-12-25 HISTORY — DX: Nausea with vomiting, unspecified: R11.2

## 2012-12-25 HISTORY — PX: BREAST LUMPECTOMY WITH NEEDLE LOCALIZATION: SHX5759

## 2012-12-25 HISTORY — DX: Other specified postprocedural states: Z98.890

## 2012-12-25 HISTORY — DX: Presence of spectacles and contact lenses: Z97.3

## 2012-12-25 LAB — POCT HEMOGLOBIN-HEMACUE: Hemoglobin: 13.5 g/dL (ref 12.0–15.0)

## 2012-12-25 SURGERY — BREAST LUMPECTOMY WITH NEEDLE LOCALIZATION
Anesthesia: General | Site: Breast | Laterality: Right | Wound class: Clean

## 2012-12-25 MED ORDER — OXYCODONE HCL 5 MG/5ML PO SOLN: 5.0000 mg | Freq: Once | ORAL | Status: DC | PRN

## 2012-12-25 MED ORDER — PROPOFOL 10 MG/ML IV BOLUS
INTRAVENOUS | Status: DC | PRN
  Administered 2012-12-25: 120 mg via INTRAVENOUS

## 2012-12-25 MED ORDER — ONDANSETRON HCL 4 MG/2ML IJ SOLN
INTRAMUSCULAR | Status: DC | PRN
  Administered 2012-12-25: 4 mg via INTRAVENOUS

## 2012-12-25 MED ORDER — OXYCODONE HCL 5 MG PO TABS: 5.0000 mg | ORAL_TABLET | Freq: Once | ORAL | Status: DC | PRN

## 2012-12-25 MED ORDER — FENTANYL CITRATE 0.05 MG/ML IJ SOLN
INTRAMUSCULAR | Status: DC | PRN
  Administered 2012-12-25: 50 ug via INTRAVENOUS
  Administered 2012-12-25: 25 ug via INTRAVENOUS

## 2012-12-25 MED ORDER — FENTANYL CITRATE 0.05 MG/ML IJ SOLN
INTRAMUSCULAR | Status: AC
  Filled 2012-12-25: qty 2

## 2012-12-25 MED ORDER — FENTANYL CITRATE 0.05 MG/ML IJ SOLN: 50.0000 ug | INTRAMUSCULAR | Status: DC | PRN

## 2012-12-25 MED ORDER — BUPIVACAINE-EPINEPHRINE PF 0.5-1:200000 % IJ SOLN
INTRAMUSCULAR | Status: AC
  Filled 2012-12-25: qty 30

## 2012-12-25 MED ORDER — LIDOCAINE HCL (CARDIAC) 20 MG/ML IV SOLN
INTRAVENOUS | Status: DC | PRN
  Administered 2012-12-25: 40 mg via INTRAVENOUS

## 2012-12-25 MED ORDER — DEXAMETHASONE SODIUM PHOSPHATE 4 MG/ML IJ SOLN
INTRAMUSCULAR | Status: DC | PRN
  Administered 2012-12-25: 10 mg via INTRAVENOUS

## 2012-12-25 MED ORDER — BUPIVACAINE-EPINEPHRINE 0.5% -1:200000 IJ SOLN
INTRAMUSCULAR | Status: DC | PRN
  Administered 2012-12-25: 27 mL

## 2012-12-25 MED ORDER — PROPOFOL 10 MG/ML IV EMUL
INTRAVENOUS | Status: AC
  Filled 2012-12-25: qty 50

## 2012-12-25 MED ORDER — SCOPOLAMINE 1 MG/3DAYS TD PT72
MEDICATED_PATCH | TRANSDERMAL | Status: AC
  Filled 2012-12-25: qty 1

## 2012-12-25 MED ORDER — MIDAZOLAM HCL 2 MG/2ML IJ SOLN: 1.0000 mg | INTRAMUSCULAR | Status: DC | PRN

## 2012-12-25 MED ORDER — MIDAZOLAM HCL 5 MG/5ML IJ SOLN
INTRAMUSCULAR | Status: DC | PRN
  Administered 2012-12-25: 2 mg via INTRAVENOUS

## 2012-12-25 MED ORDER — MIDAZOLAM HCL 2 MG/2ML IJ SOLN
INTRAMUSCULAR | Status: AC
  Filled 2012-12-25: qty 2

## 2012-12-25 MED ORDER — BUPIVACAINE HCL (PF) 0.5 % IJ SOLN
INTRAMUSCULAR | Status: AC
  Filled 2012-12-25: qty 30

## 2012-12-25 MED ORDER — CHLORHEXIDINE GLUCONATE 4 % EX LIQD: 1.0000 "application " | Freq: Once | CUTANEOUS | Status: DC

## 2012-12-25 MED ORDER — SCOPOLAMINE 1 MG/3DAYS TD PT72
1.0000 | MEDICATED_PATCH | TRANSDERMAL | Status: DC
  Administered 2012-12-25: 1.5 mg via TRANSDERMAL

## 2012-12-25 MED ORDER — CIPROFLOXACIN IN D5W 400 MG/200ML IV SOLN
INTRAVENOUS | Status: AC
  Filled 2012-12-25: qty 200

## 2012-12-25 MED ORDER — HYDROCODONE-ACETAMINOPHEN 5-325 MG PO TABS: 1.0000 | ORAL_TABLET | ORAL | Status: DC | PRN

## 2012-12-25 MED ORDER — LACTATED RINGERS IV SOLN
INTRAVENOUS | Status: DC
  Administered 2012-12-25 (×3): via INTRAVENOUS

## 2012-12-25 MED ORDER — CIPROFLOXACIN IN D5W 400 MG/200ML IV SOLN
400.0000 mg | INTRAVENOUS | Status: AC
  Administered 2012-12-25: 400 mg via INTRAVENOUS

## 2012-12-25 MED ORDER — HYDROMORPHONE HCL PF 1 MG/ML IJ SOLN
0.2500 mg | INTRAMUSCULAR | Status: DC | PRN
Start: 2012-12-25 — End: 2012-12-25

## 2012-12-25 SURGICAL SUPPLY — 46 items
ADH SKN CLS APL DERMABOND .7 (GAUZE/BANDAGES/DRESSINGS)
BINDER BREAST MEDIUM (GAUZE/BANDAGES/DRESSINGS) ×1 IMPLANT
BLADE SURG 10 STRL SS (BLADE) IMPLANT
BLADE SURG 15 STRL LF DISP TIS (BLADE) ×1 IMPLANT
BLADE SURG 15 STRL SS (BLADE) ×2
CANISTER SUCT 1200ML W/VALVE (MISCELLANEOUS) IMPLANT
CHLORAPREP W/TINT 26ML (MISCELLANEOUS) ×2 IMPLANT
CLIP TI MEDIUM 6 (CLIP) ×1 IMPLANT
CLIP TI WIDE RED SMALL 6 (CLIP) IMPLANT
COVER MAYO STAND STRL (DRAPES) ×2 IMPLANT
COVER TABLE BACK 60X90 (DRAPES) ×2 IMPLANT
DERMABOND ADVANCED (GAUZE/BANDAGES/DRESSINGS)
DERMABOND ADVANCED .7 DNX12 (GAUZE/BANDAGES/DRESSINGS) IMPLANT
DEVICE DUBIN W/COMP PLATE 8390 (MISCELLANEOUS) ×1 IMPLANT
DRAPE PED LAPAROTOMY (DRAPES) ×2 IMPLANT
DRAPE UTILITY XL STRL (DRAPES) ×2 IMPLANT
ELECT COATED BLADE 2.86 ST (ELECTRODE) ×2 IMPLANT
ELECT REM PT RETURN 9FT ADLT (ELECTROSURGICAL) ×2
ELECTRODE REM PT RTRN 9FT ADLT (ELECTROSURGICAL) ×1 IMPLANT
GLOVE BIO SURGEON STRL SZ7 (GLOVE) ×1 IMPLANT
GLOVE BIOGEL PI IND STRL 8 (GLOVE) ×1 IMPLANT
GLOVE BIOGEL PI INDICATOR 8 (GLOVE) ×1
GLOVE SS BIOGEL STRL SZ 7.5 (GLOVE) ×1 IMPLANT
GLOVE SUPERSENSE BIOGEL SZ 7.5 (GLOVE) ×2
GOWN PREVENTION PLUS XLARGE (GOWN DISPOSABLE) ×2 IMPLANT
GOWN PREVENTION PLUS XXLARGE (GOWN DISPOSABLE) ×2 IMPLANT
KIT MARKER MARGIN INK (KITS) IMPLANT
NDL HYPO 25X1 1.5 SAFETY (NEEDLE) ×1 IMPLANT
NEEDLE HYPO 25X1 1.5 SAFETY (NEEDLE) ×2 IMPLANT
NS IRRIG 1000ML POUR BTL (IV SOLUTION) ×2 IMPLANT
PACK BASIN DAY SURGERY FS (CUSTOM PROCEDURE TRAY) ×2 IMPLANT
PENCIL BUTTON HOLSTER BLD 10FT (ELECTRODE) ×2 IMPLANT
SLEEVE SCD COMPRESS KNEE MED (MISCELLANEOUS) ×1 IMPLANT
STAPLER VISISTAT 35W (STAPLE) IMPLANT
SUT MON AB 3-0 SH 27 (SUTURE)
SUT MON AB 3-0 SH27 (SUTURE) IMPLANT
SUT MON AB 5-0 PS2 18 (SUTURE) ×2 IMPLANT
SUT SILK 3 0 SH 30 (SUTURE) IMPLANT
SUT VIC AB 4-0 BRD 54 (SUTURE) IMPLANT
SUT VICRYL 3-0 CR8 SH (SUTURE) ×2 IMPLANT
SYR BULB 3OZ (MISCELLANEOUS) IMPLANT
SYR CONTROL 10ML LL (SYRINGE) ×2 IMPLANT
TOWEL OR 17X24 6PK STRL BLUE (TOWEL DISPOSABLE) ×4 IMPLANT
TOWEL OR NON WOVEN STRL DISP B (DISPOSABLE) ×2 IMPLANT
TUBE CONNECTING 20X1/4 (TUBING) ×1 IMPLANT
YANKAUER SUCT BULB TIP NO VENT (SUCTIONS) ×1 IMPLANT

## 2012-12-25 NOTE — Interval H&P Note (Signed)
History and Physical Interval Note:  12/25/2012 12:51 PM  Ana Craig  has presented today for surgery, with the diagnosis of dcis right breast   The various methods of treatment have been discussed with the patient and family. After consideration of risks, benefits and other options for treatment, the patient has consented to  Procedure(s): BREAST LUMPECTOMY WITH NEEDLE LOCALIZATION (Right) as a surgical intervention .  The patient's history has been reviewed, patient examined, no change in status, stable for surgery.  I have reviewed the patient's chart and labs.  Questions were answered to the patient's satisfaction.     Joelyn Lover T

## 2012-12-25 NOTE — Anesthesia Procedure Notes (Signed)
Procedure Name: LMA Insertion Date/Time: 12/25/2012 1:09 PM Performed by: Gar Gibbon Pre-anesthesia Checklist: Patient identified, Emergency Drugs available, Suction available and Patient being monitored Patient Re-evaluated:Patient Re-evaluated prior to inductionOxygen Delivery Method: Circle System Utilized Preoxygenation: Pre-oxygenation with 100% oxygen Intubation Type: IV induction Ventilation: Mask ventilation without difficulty LMA: LMA inserted LMA Size: 3.0 Number of attempts: 1 Airway Equipment and Method: bite block Placement Confirmation: positive ETCO2 Tube secured with: Tape Dental Injury: Teeth and Oropharynx as per pre-operative assessment

## 2012-12-25 NOTE — Anesthesia Postprocedure Evaluation (Signed)
  Anesthesia Post-op Note  Patient: Ana Craig  Procedure(s) Performed: Procedure(s): BREAST LUMPECTOMY WITH NEEDLE LOCALIZATION (Right)  Patient Location: PACU  Anesthesia Type:General  Level of Consciousness: awake and alert   Airway and Oxygen Therapy: Patient Spontanous Breathing  Post-op Pain: none  Post-op Assessment: Post-op Vital signs reviewed, Patient's Cardiovascular Status Stable and Respiratory Function Stable  Post-op Vital Signs: Reviewed  Filed Vitals:   12/25/12 1410  BP: 114/64  Pulse:   Temp: 36.4 C  Resp:     Complications: No apparent anesthesia complications

## 2012-12-25 NOTE — Progress Notes (Signed)
[facility] Psychosocial Distress Screening Clinical Social Work  Patient completed distress screening protocol, and scored a 1 on the Psychosocial Distress Thermometer which indicates mild distress. Clinical Social Worker met with pt in [facility] on 12/18/12 to assess for distress and other psychosocial needs.  Pt stated she felt "comfortable" after speaking with the physicians and getting more information on her treatment plan.  CSW acknowledged the emotional aspect of pt's diagnosis and treatment process, and informed pt of the support team and support services at [facility].  CSW encouraged pt to call with any questions or concerns.      Tamala Julian, MSW, LCSW Clinical Social Worker [facility] [phone]

## 2012-12-25 NOTE — H&P (View-Only) (Signed)
Chief Complaint: New diagnosis of breast cancer  History:    Ana Craig is a 61 y.o. postmenopausal female referred by Dr. Brown  for evaluation of recently diagnosed carcinoma of the right breast. She recently presented for a screening mamogram revealing a small area of calcifications and possible mass in the right breast..  Subsequent imaging included diagnostic mamogram showing a 5 mm area of grouped calcifications in the upper central portion of the right breast. 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 She is seen now in [facility] for initial treatment planning.  She has experienced no breast symptoms, specifically no lump, pain, nipple discharge or skin changes..  She does not have a personal history of any previous breast problems. Bilateral breast MRI and has been scheduled for later this week.  Findings at that time were the following:  Tumor size: 5mm cm  Tumor grade: 3  Estrogen Receptor: positive Progesterone Receptor: negative   Past Medical History  Diagnosis Date  . Breast cancer   . Osteoporosis   . Hypothyroidism   . Arthritis     Past Surgical History  Procedure Laterality Date  . Abdominal hysterectomy    . Appendectomy      Current Outpatient Prescriptions  Medication Sig Dispense Refill  . levothyroxine (SYNTHROID, LEVOTHROID) 75 MCG tablet 75 mcg daily.       No current facility-administered medications for this visit.    No family history on file.  History   Social History  . Marital Status: Married    Spouse Name: N/A    Number of Children: N/A  . Years of Education: N/A   Social History Main Topics  . Smoking status: Never Smoker   . Smokeless tobacco: Not on file  . Alcohol Use: No  . Drug Use: No  . Sexual Activity: Not on file   Other Topics Concern  . Not on file   Social History Narrative  . No narrative on file     Review of Systems Constitutional:  negative Respiratory: negative Cardiovascular: negative Gastrointestinal: negative, colonoscopy is up-to-date     Objective:  There were no vitals taken for this visit.  General: Alert, well-developed thin Caucasian female, in no distress Skin: Warm and dry without rash or infection. HEENT: No palpable masses or thyromegaly. Sclera nonicteric. Pupils equal round and reactive. Oropharynx clear. Breasts: healing biopsy site and minimal bruising in the upper central right breast. No palpable masses in either breast. No skin changes. No palpable axillary adenopathy. Lymph nodes: No cervical, supraclavicular, or inguinal nodes palpable. Lungs: Breath sounds clear and equal without increased work of breathing Cardiovascular: Regular rate and rhythm without murmur. No JVD or edema. Peripheral pulses intact. Abdomen: Nondistended. Soft and nontender. No masses palpable. No organomegaly. No palpable hernias. Extremities: No edema or joint swelling or deformity. No chronic venous stasis changes. Neurologic: Alert and fully oriented. Gait normal.   Laboratory data:  CBC:  Lab Results  Component Value Date   WBC 5.3 12/18/2012   RBC 4.54 12/18/2012   HGB 14.0 12/18/2012   HCT 41.8 12/18/2012   PLT 297 12/18/2012  ]  [facility] Labs:  Lab Results  Component Value Date   NA 142 12/18/2012   K 4.3 12/18/2012   CO2 28 12/18/2012   BUN 14.4 12/18/2012   CREATININE 1.0 12/18/2012   CALCIUM 10.1 12/18/2012   PROT 7.8 12/18/2012   BILITOT 1.12 12/18/2012   ALKPHOS 84 12/18/2012     AST 26 12/18/2012   ALT 25 12/18/2012     Assessment  61 y.o. female with a new diagnosis of cancer of the the right breast upper central.  Clinical 0, estrogen receptor positive. I discussed with the patient and her friend present today initial surgical treatment options. We discussed options of breast conservation with lumpectomy or total mastectomy and sentinal lymph node biopsy/dissection. Options for reconstruction  were discussed. After discussion they have elected to proceed with right partial mastectomy.  We discussed the indications and nature of the procedure, and expected recovery, in detail. Surgical risks including anesthetic complications, cardiorespiratory complications, bleeding, infection, wound healing complications, blood clots, lymphedema, local and distant recurrence and possible need for further surgery based on the final pathology was discussed and understood.  Chemotherapy, hormonal therapy and radiation therapy have been discussed. They have been provided with literature regarding the treatment of breast cancer.  All questions were answered. They understand and agree to proceed and we will go ahead with scheduling.  Plan right partial mastectomy, needle localized as an outpatient under general anesthesia. This is of course pending the results of her MRI scheduled for later this week.  Francesco Provencal T Analayah Brooke MD, FACS  12/18/2012, 10:19 AM 

## 2012-12-25 NOTE — Anesthesia Preprocedure Evaluation (Signed)
Anesthesia Evaluation  Patient identified by MRN, date of birth, ID band Patient awake    Reviewed: Allergy & Precautions, H&P , NPO status , Patient's Chart, lab work & pertinent test results  History of Anesthesia Complications (+) PONV  Airway Mallampati: II TM Distance: >3 FB Neck ROM: Full    Dental no notable dental hx. (+) Teeth Intact and Dental Advisory Given   Pulmonary neg pulmonary ROS,  breath sounds clear to auscultation  Pulmonary exam normal       Cardiovascular negative cardio ROS  Rhythm:Regular Rate:Normal     Neuro/Psych negative neurological ROS  negative psych ROS   GI/Hepatic negative GI ROS, Neg liver ROS,   Endo/Other  Hypothyroidism   Renal/GU negative Renal ROS  negative genitourinary   Musculoskeletal   Abdominal   Peds  Hematology negative hematology ROS (+)   Anesthesia Other Findings   Reproductive/Obstetrics negative OB ROS                           Anesthesia Physical Anesthesia Plan  ASA: II  Anesthesia Plan: General   Post-op Pain Management:    Induction: Intravenous  Airway Management Planned: LMA  Additional Equipment:   Intra-op Plan:   Post-operative Plan: Extubation in OR  Informed Consent: I have reviewed the patients History and Physical, chart, labs and discussed the procedure including the risks, benefits and alternatives for the proposed anesthesia with the patient or authorized representative who has indicated his/her understanding and acceptance.   Dental advisory given  Plan Discussed with: CRNA  Anesthesia Plan Comments:         Anesthesia Quick Evaluation

## 2012-12-25 NOTE — Transfer of Care (Signed)
Immediate Anesthesia Transfer of Care Note  Patient: Ana Craig  Procedure(s) Performed: Procedure(s): BREAST LUMPECTOMY WITH NEEDLE LOCALIZATION (Right)  Patient Location: PACU  Anesthesia Type:General  Level of Consciousness: awake, sedated and patient cooperative  Airway & Oxygen Therapy: Patient Spontanous Breathing and Patient connected to face mask oxygen  Post-op Assessment: Report given to PACU RN and Post -op Vital signs reviewed and stable  Post vital signs: Reviewed and stable  Complications: No apparent anesthesia complications

## 2012-12-25 NOTE — Op Note (Signed)
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 After extensive discussion of treatment options and risks detailed elsewhere we have elected to proceed with needle localized lumpectomy.    Procedure Detail:  Following needle localization at the breast center the patient is brought to the operating room, placed in supine position on the operating table, and laryngeal mask general anesthesia induced. She received preoperative IV antibiotics. The right breast was widely sterilely prepped and draped. The patient time out was performed and correct site and procedure verified.  I made a curvilinear incision at the 12:00 position just above the right nipple at the estimated site of the lesion and dissection was carried down through the subcutaneous tissue the breast capsule. The dissection was deepened superiorly to the wire insertion site of the wire encountered and brought into the incision. I then excised a generous portion of breast tissue around the shaft of the wire in an effort to obtain negative margins extending down toward the central breast. The wire was several centimeters past the lesion at its tip and the wire was removed intact with the specimen. The specimen was inked for margins and specimen mammogram obtained. This showed the clip and the wire contained within the specimen although the clip was a little closer to the inferior end of the specimen. I took an additional approximately half centimeter of breast tissue at the inferior margin which was oriented and sent for permanent section. Soft tissue was infiltrated  with Marcaine. Complete hemostasis was assured. The lumpectomy cavity was marked with clips. The breast the subcutaneous tissue was closed with interrupted 3-0 Vicryl and the skin with subcuticular 4-0 Monocryl and Dermabond. Sponge needle and instrument counts were correct.            Specimens: #1 right breast lumpectomy      #2 further inferior margin        Complications:  * No complications entered in OR log *         Disposition: PACU - hemodynamically stable.         Condition: stable

## 2012-12-26 ENCOUNTER — Encounter: Payer: Self-pay | Admitting: *Deleted

## 2012-12-26 ENCOUNTER — Encounter (HOSPITAL_BASED_OUTPATIENT_CLINIC_OR_DEPARTMENT_OTHER): Payer: Self-pay | Admitting: General Surgery

## 2012-12-26 NOTE — Progress Notes (Signed)
Mailed after appt letter to pt. 

## 2012-12-27 ENCOUNTER — Encounter: Payer: Self-pay | Admitting: *Deleted

## 2012-12-30 ENCOUNTER — Telehealth: Payer: Self-pay | Admitting: Oncology

## 2012-12-30 ENCOUNTER — Telehealth (INDEPENDENT_AMBULATORY_CARE_PROVIDER_SITE_OTHER): Payer: Self-pay | Admitting: *Deleted

## 2012-12-30 NOTE — Telephone Encounter (Signed)
I called pt to check on her postoperatively.  She has no complaints states she is doing and feeling good.  I informed her of her postop appt with Dr. Johna Sheriff on 01/10/13 at 11:00am.  I instructed her to call our office with any questions or concerns.  She is agreeable with this plan.

## 2012-12-30 NOTE — Telephone Encounter (Signed)
, °

## 2013-01-01 ENCOUNTER — Telehealth (INDEPENDENT_AMBULATORY_CARE_PROVIDER_SITE_OTHER): Payer: Self-pay

## 2013-01-01 NOTE — Telephone Encounter (Signed)
Patient ask for path results, informed her margins are negative, no tumor seen per results. Appointment 01-10-13 with Dr. Johna Sheriff ,he will give her copy at that time .

## 2013-01-01 NOTE — Progress Notes (Signed)
Location of Breast Cancer: Right Breast:  RIght Breast- DUCTAL CARCINOMA IN SITU WITH CALCIFICATIONS  Histology per Pathology Report: Right Breast  Diagnosis 1. 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 - MARGINS ARE NEGATIVE. - SEE ONCOLOGY TEMPLATE. 2. Breast, excision, Right inferior margin - BENIGN BREAST PARENCHYMA WITH FIBROCYSTIC CHANGES AND FAT NECROSIS. - NO TUMOR SEEN.  Receptor Status: ER(3), PR (0), Her2-neu ()  Did patient present with symptoms (if so, please note symptoms) or was this found on screening mammography?: Found on Routine Mammogram  Past/Anticipated interventions by surgeon, if any: Lumpectomy Right Breast  Past/Anticipated interventions by medical oncology, if any: Chemotherapy: Unknown  Lymphedema issues, if any:  None  Pain issues, if any: Denies any surgical pain  SAFETY ISSUES:  Prior radiation? No  Pacemaker/ICD?No  Possible current pregnancy?No  Is the patient on methotrexate? NO  Current Complaints / other details:  No surgical pain, but has new presentation of back pain this week which is causing her to lean to the left

## 2013-01-09 ENCOUNTER — Encounter: Payer: Self-pay | Admitting: Radiation Oncology

## 2013-01-09 ENCOUNTER — Ambulatory Visit
Admission: RE | Admit: 2013-01-09 | Discharge: 2013-01-09 | Disposition: A | Discharge status: Home / Self Care | Payer: 59 | Source: Ambulatory Visit | Attending: Radiation Oncology | Admitting: Radiation Oncology

## 2013-01-09 ENCOUNTER — Encounter: Payer: Self-pay | Admitting: *Deleted

## 2013-01-09 ENCOUNTER — Telehealth: Payer: Self-pay | Admitting: *Deleted

## 2013-01-09 VITALS — BP 106/70 | HR 91 | Temp 97.6°F | Ht 67.0 in | Wt 128.0 lb

## 2013-01-09 DIAGNOSIS — C50811 Malignant neoplasm of overlapping sites of right female breast: Secondary | ICD-10-CM

## 2013-01-09 DIAGNOSIS — D0590 Unspecified type of carcinoma in situ of unspecified breast: Secondary | ICD-10-CM | POA: Insufficient documentation

## 2013-01-09 NOTE — Telephone Encounter (Signed)
Called patient to inform of appt. With Dr. Rushie Chestnut on 01-13-13 - arrival time- 10 am, lvm for a return call

## 2013-01-09 NOTE — Progress Notes (Signed)
   Department of Radiation Oncology  Phone:  [phone] Fax:        [phone]   Name: Forrestine Lecrone MRN: [id number]  DOB: 29-Apr-1950  Date: 01/09/2013  Follow Up Visit Note  Diagnosis: DCIS of the right breast  Interval History: Aubrei presents today for routine followup.  She had her lumpectomy on 1119. She had a 2 cm area of high-grade ductal carcinoma in situ. Margins were negative. The estrogen was positive at 3%. The distance to the closest margin was 0.4 cm. She has an appointment with Dr. Dillard Essex next week. She would still like to receive her radiation in [place] as it is right around the corner from her home.  Allergies:  Allergies  Allergen Reactions  . Penicillins Rash    Medications:  Current Outpatient Prescriptions  Medication Sig Dispense Refill  . calcium carbonate (OS-CAL) 600 MG TABS tablet Take 600 mg by mouth 2 (two) times daily with a meal.      . cholecalciferol (VITAMIN D) 1000 UNITS tablet Take 1,000 Units by mouth daily.      Marland Kitchen ibuprofen (ADVIL,MOTRIN) 200 MG tablet Take 600 mg by mouth every 6 (six) hours as needed. Taking for back pain      . levothyroxine (SYNTHROID, LEVOTHROID) 75 MCG tablet 75 mcg daily.      . Multiple Vitamins-Minerals (MULTIVITAMIN WITH MINERALS) tablet Take 1 tablet by mouth daily.      . vitamin C (ASCORBIC ACID) 500 MG tablet Take 500 mg by mouth daily.      Marland Kitchen HYDROcodone-acetaminophen (NORCO/VICODIN) 5-325 MG per tablet Take 1-2 tablets by mouth every 4 (four) hours as needed.  30 tablet  0   No current facility-administered medications for this encounter.    Physical Exam:  Filed Vitals:   01/09/13 1008  BP: 106/70  Pulse: 91  Temp: 97.6 F (36.4 C)   she has a well-healed incision in the superior aspect of the right breast. No erythema or signs of infection.  IMPRESSION: Nazaret is a 62 y.o. female status post lumpectomy for DCIS  PLAN:  I spoke with Ms. Lee regarding the indications for radiation and decreasing  local failures in patients who undergo lumpectomy. We discussed the process of simulation the placement tattoos. We discussed 6 weeks of treatment as an outpatient. We discussed the use of 3-D radiotherapy to avoid her lungs and ribs. I think she would be fine to get radiation in [place] as it is closer for her. I will set up an appointment for her to meet Dr. Aggie Cosier next week. I've also ordered a preradiation mammogram to ensure all calcifications have been removed. She was headed over for a massage following this and has signed a form for another 3 massage for her as well. I encouraged her to contact me if she ultimately decided to have radiation here. She agreed to do so. She will be meeting with Dr. Welton Flakes regarding antiestrogen therapy next week as well.    Lurline Hare, MD

## 2013-01-10 ENCOUNTER — Telehealth: Payer: Self-pay | Admitting: *Deleted

## 2013-01-10 ENCOUNTER — Ambulatory Visit (INDEPENDENT_AMBULATORY_CARE_PROVIDER_SITE_OTHER): Payer: 59 | Admitting: General Surgery

## 2013-01-10 VITALS — BP 112/62 | HR 72 | Temp 97.2°F | Resp 18 | Ht 67.0 in | Wt 127.0 lb

## 2013-01-10 DIAGNOSIS — C50811 Malignant neoplasm of overlapping sites of right female breast: Secondary | ICD-10-CM

## 2013-01-10 DIAGNOSIS — C50919 Malignant neoplasm of unspecified site of unspecified female breast: Secondary | ICD-10-CM

## 2013-01-10 NOTE — Progress Notes (Signed)
History: The patient returns following right breast needle localized lumpectomy for ductal carcinoma in situ. She is getting along very well and reports no discomfort or other issues. She has seen radiation oncology and radiation will start in January. She also has an appointment for follow up with medical oncology.  Exam: BP 112/62  Pulse 72  Temp(Src) 97.2 F (36.2 C)  Resp 18  Ht 5\' 7"  (1.702 m)  Wt 127 lb (57.607 kg)  BMI 19.89 kg/m2 General: Appears well Breasts: Right breast incision is nicely healed without swelling or erythema or other problems.  Pathology: 1. 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 - MARGINS ARE NEGATIVE. - SEE ONCOLOGY TEMPLATE. 2. Breast, excision, Right inferior margin - BENIGN BREAST PARENCHYMA WITH FIBROCYSTIC CHANGES AND FAT NECROSIS.  Assessment and plan: Doing well following right breast lumpectomy. She is finished with her surgical treatment. Radiation plan the next month. Medical oncology appointment in place. I will see her in 6 months for long-term followup.

## 2013-01-10 NOTE — Telephone Encounter (Signed)
CALLED PATIENT TO INFORM THAT APPT. WITH DR. Rushie Chestnut HAS BEEN MOVED UP TO 9:00 AM ON 01-13-13, SPOKE WITH PATIENT AND SHE IS AWARE OF THE APPT. CHANGE., FAXED INFO TO DR. Kipp Laurence OFFICE ON 01-10-13- CONFIRMATION RECEIVED WHERE INFO WENT THRU.

## 2013-01-13 ENCOUNTER — Ambulatory Visit: Payer: Self-pay | Admitting: Radiation Oncology

## 2013-01-16 ENCOUNTER — Ambulatory Visit
Admission: RE | Admit: 2013-01-16 | Discharge: 2013-01-16 | Disposition: A | Discharge status: Home / Self Care | Payer: 59 | Source: Ambulatory Visit | Attending: Radiation Oncology | Admitting: Radiation Oncology

## 2013-01-16 DIAGNOSIS — C50811 Malignant neoplasm of overlapping sites of right female breast: Secondary | ICD-10-CM

## 2013-01-17 ENCOUNTER — Encounter: Payer: Self-pay | Admitting: Oncology

## 2013-01-17 ENCOUNTER — Telehealth: Payer: Self-pay | Admitting: Oncology

## 2013-01-17 ENCOUNTER — Ambulatory Visit (HOSPITAL_BASED_OUTPATIENT_CLINIC_OR_DEPARTMENT_OTHER): Payer: 59 | Admitting: Oncology

## 2013-01-17 VITALS — BP 126/73 | HR 96 | Temp 97.1°F | Resp 18 | Ht 67.0 in | Wt 130.4 lb

## 2013-01-17 DIAGNOSIS — C50811 Malignant neoplasm of overlapping sites of right female breast: Secondary | ICD-10-CM

## 2013-01-17 DIAGNOSIS — D0590 Unspecified type of carcinoma in situ of unspecified breast: Secondary | ICD-10-CM

## 2013-01-17 NOTE — Progress Notes (Signed)
Brittany Osier [id number] 02-20-1950 62 y.o. 01/17/2013 3:27 PM  CC Dr. Rene Paci Dr. Glenna Fellows Dr. Lurline Hare  Diagnosis:  62 year old female with new diagnosis of right breast cancer (DCIS) patient is seen in the multidisciplinary breast clinic for discussion of treatment options.  STAGE:   Cancer of midline of female breast   Primary site: Breast (Right)   Staging method: AJCC 7th Edition   Clinical: Stage 0 (Tis (DCIS), N0, cM0)   Summary: Stage 0 (Tis (DCIS), N0, cM0)  REFERRING PHYSICIAN: Dr. Glenna Fellows  Past oncologic history :  Lasondra Hodgkins is a 62 y.o. female.  Would medical history significant for osteoporosis and hypothyroidism and arthritis.  1.Patient underwent bilateral screening mammogram on 11/21/2012. The mammogram revealed calcifications and a possible mass. She was recommended further evaluation of the right breast. In the left breast no masses or malignant type calcifications are identified. On 12/12/2012 patient had a right diagnostic mammogram performed this did not show a definite nodule in the central aspect of the right breast on magnification views there was noted to be group of calcifications in the upper central portion of the breasts. These measure 5 mm in diameter. Because of this she was recommended ultrasound which showed normal appearing common dense fibroglandular tissue no mass or distortion or acoustic shadowing. 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 Tumor was ER positive PR negative. Her MRI is pending. Patient is without any complaints related to her breast. Her case was discussed at the multidisciplinary breast conference this morning and she is now seen in the multidisciplinary breast clinic for discussion of further treatment options.  2. S/p lumpectomy on 12/25/12, pathology: Diagnosis 1. Breast, lumpectomy, Right - HIGH  GRADE DUCTAL CARCINOMA IN SITU WITH NECROSIS AND ASSOCIATED CALCIFICATIONS INVOLVING APPROXIMATELY 2 CM WORTH OF BREAST PARENCHYMA. - MARGINS ARE NEGATIVE. - SEE ONCOLOGY TEMPLATE. 2. Breast, excision, Right inferior margin - BENIGN BREAST PARENCHYMA WITH FIBROCYSTIC CHANGES AND FAT NECROSIS. - NO TUMOR SEEN.  Interval history: patient returns after lumpectomy. She is doing well. She has been sen by RT. She will be receving RT in [place]. She has no complaints. 10 point review of systems is negative  Past Medical History: Past Medical History  Diagnosis Date  . 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 Osteoporosis   . Hypothyroidism   . Arthritis   . PONV (postoperative nausea and vomiting)   . Wears glasses     Past Surgical History: Past Surgical History  Procedure Laterality Date  . Appendectomy  2002    lap  . Abdominal hysterectomy  1998  . Tonsillectomy    . Colonoscopy    . Breast lumpectomy with needle localization Right 12/25/2012    Procedure: BREAST LUMPECTOMY WITH NEEDLE LOCALIZATION;  Surgeon: Mariella Saa, MD;  Location: [facility];  Service: General;  Laterality: Right;    Family History: History reviewed. No pertinent family history.  Social History History  Substance Use Topics  . Smoking status: Never Smoker   . Smokeless tobacco: Not on file  . Alcohol Use: No    Allergies: Allergies  Allergen Reactions  . Penicillins Rash    Current Medications: Current Outpatient Prescriptions  Medication Sig Dispense Refill  . calcium carbonate (OS-CAL) 600 MG TABS tablet Take 600 mg by mouth 2 (two) times daily with a meal.      . cholecalciferol (VITAMIN  D) 1000 UNITS tablet Take 1,000 Units by mouth daily.      Marland Kitchen ibuprofen (ADVIL,MOTRIN) 200 MG tablet Take 600 mg by mouth every 6 (six) hours as needed. Taking for back pain      . levothyroxine (SYNTHROID, LEVOTHROID) 75 MCG tablet 75 mcg daily.      .  Multiple Vitamins-Minerals (MULTIVITAMIN WITH MINERALS) tablet Take 1 tablet by mouth daily.      . vitamin C (ASCORBIC ACID) 500 MG tablet Take 500 mg by mouth daily.       No current facility-administered medications for this visit.    OB/GYN History: menarche at age 6 patient underwent menopause she did go on hormone replacement therapy she stopped in 1999 she is nulliparous area in she has had use of oral contraceptive pills.  Fertility Discussion:not applicable  Prior History of Cancer: no  Health Maintenance:  Colonoscopy yes  Bone Density yes Last PAP smear unknown   ECOG PERFORMANCE STATUS: 0 - Asymptomatic  Genetic Counseling/testing: no   REVIEW OF SYSTEMS:  A comprehensive review of systems was negative.  PHYSICAL EXAMINATION: Blood pressure 126/73, pulse 96, temperature 97.1 F (36.2 C), temperature source Oral, resp. rate 18, height 5\' 7"  (1.702 m), weight 130 lb 6.4 oz (59.149 kg).  ZOX:WRUEA, healthy, no distress, well nourished and well developed SKIN: skin color, texture, turgor are normal HEAD: Normocephalic EYES: normal, PERRLA, EOMI, Conjunctiva are pink and non-injected EARS: External ears normal OROPHARYNX:no exudate, no erythema and lips, buccal mucosa, and tongue normal  NECK: supple, no adenopathy, thyroid normal size, non-tender, without nodularity LYMPH:  no palpable lymphadenopathy, no hepatosplenomegaly BREAST:left breast normal without mass, skin or nipple changes or axillary nodes, area of ecchymosis is noted in the right breast with thickness of the biopsy site no other palpable masses nipple discharge or other skin changes  LUNGS: clear to auscultation and percussion HEART: regular rate & rhythm, no murmurs and no gallops ABDOMEN:abdomen soft, non-tender, normal bowel sounds and no masses or organomegaly BACK: Back symmetric, no curvature., No CVA tenderness, Range of motion is normal EXTREMITIES:no edema, no clubbing, no cyanosis  NEURO:  alert & oriented x 3 with fluent speech, no focal motor/sensory deficits, gait normal, reflexes normal and symmetric     STUDIES/RESULTS: US Breast Right  12/12/2012   CLINICAL DATA:  The patient presents for further evaluation of right breast calcifications and a possible right breast mass.  EXAM: DIGITAL DIAGNOSTIC  right MAMMOGRAM  ULTRASOUND right BREAST  COMPARISON:  11/21/2012  ACR Breast Density Category c: The breasts are heterogeneously dense, which may obscure small masses.  FINDINGS: Additional views are performed, showing no definite nodule in the central aspect of the right breast on spot compression view. On magnified views, there is a grouping of calcifications in the upper central portion of the breast. On magnified views, this grouping measures approximately 5 mm in diameter. Calcifications vary in size, shape, and density. Biopsy is recommended to exclude ductal carcinoma in situ.  On physical exam I palpate no abnormality in the central aspect of the right breast.  Ultrasound is performed, showing normal appearing common dense fibroglandular tissue in the retroareolar region of the right breast. No mass, distortion, or acoustic shadowing is demonstrated with ultrasound.  IMPRESSION: 1. Suspicious microcalcifications in the upper central portion of the right breast. 2. No persistent mass on further evaluation.  RECOMMENDATION: Stereotactic guided core biopsy is recommended for right breast calcifications. This will be performed later today at the request of  the patient.  I have discussed the findings and recommendations with the patient. Results were also provided in writing at the conclusion of the visit. If applicable, a reminder letter will be sent to the patient regarding the next appointment.  BI-RADS CATEGORY  4: Suspicious abnormality - biopsy should be considered.   Electronically Signed   By: Rosalie Gums M.D.   On: 12/12/2012 11:49   Mm Digital Diag Ltd R  12/12/2012   CLINICAL  DATA:  The patient presents for further evaluation of right breast calcifications and a possible right breast mass.  EXAM: DIGITAL DIAGNOSTIC  right MAMMOGRAM  ULTRASOUND right BREAST  COMPARISON:  11/21/2012  ACR Breast Density Category c: The breasts are heterogeneously dense, which may obscure small masses.  FINDINGS: Additional views are performed, showing no definite nodule in the central aspect of the right breast on spot compression view. On magnified views, there is a grouping of calcifications in the upper central portion of the breast. On magnified views, this grouping measures approximately 5 mm in diameter. Calcifications vary in size, shape, and density. Biopsy is recommended to exclude ductal carcinoma in situ.  On physical exam I palpate no abnormality in the central aspect of the right breast.  Ultrasound is performed, showing normal appearing common dense fibroglandular tissue in the retroareolar region of the right breast. No mass, distortion, or acoustic shadowing is demonstrated with ultrasound.  IMPRESSION: 1. Suspicious microcalcifications in the upper central portion of the right breast. 2. No persistent mass on further evaluation.  RECOMMENDATION: Stereotactic guided core biopsy is recommended for right breast calcifications. This will be performed later today at the request of the patient.  I have discussed the findings and recommendations with the patient. Results were also provided in writing at the conclusion of the visit. If applicable, a reminder letter will be sent to the patient regarding the next appointment.  BI-RADS CATEGORY  4: Suspicious abnormality - biopsy should be considered.   Electronically Signed   By: Rosalie Gums M.D.   On: 12/12/2012 11:49   Mm Digital Screening  11/21/2012   CLINICAL DATA:  Screening.  EXAM: DIGITAL SCREENING BILATERAL MAMMOGRAM WITH CAD  DIGITAL BREAST TOMOSYNTHESIS  Digital breast tomosynthesis images are acquired in two projections. These images  are reviewed in combination with the digital mammogram, confirming the findings below.  COMPARISON:  Previous Exam(s)  ACR Breast Density Category c: The breasts are heterogeneously dense, which may obscure small masses.  FINDINGS: In the right breast, calcifications and a possible mass warrant further evaluation with magnified views. In the left breast, no mass or malignant type calcifications are identified. Images were processed with CAD.  IMPRESSION: Further evaluation is suggested for calcifications in the right breast.  RECOMMENDATION: Diagnostic mammogram and possibly ultrasound of the right breast. (Code:[id number])  The patient will be contacted regarding the findings, and additional imaging will be scheduled.  BI-RADS CATEGORY  0: Incomplete. Need additional imaging evaluation and/or prior mammograms for comparison.   Electronically Signed   By: Esperanza Heir M.D.   On: 11/21/2012 15:05   Mm Radiologist Eval And Mgmt  12/18/2012   CONSULTATION: HPI: Ms. Larina Bras returns for a followup after a right breast stereotactic biopsy 12/12/2012. The patient is doing well and denies any biopsy site complications.  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 This is concordant with the imaging findings.  Biopsy site: Clean, dry, and intact. Steri-Strips overlie small skin incision.  PLAN: All the patient's  questions were answered. She is aware of the results and demonstrates understanding. The patient is scheduled to follow-up in Multidisciplinary conference 12/18/2012 and a breast MRI is scheduled on 12/20/2012.   Electronically Signed   By: Edwin Cap M.D.   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 This is concordant with the imaging findings.  The patient is aware  of the results. She denies any biopsy site complications.  Ms. Nedra Hai is scheduled to follow-up in Multidisciplinary conference on 12/18/2012 and a breast MRI is scheduled on 12/20/2012.   Electronically Signed   By: Edwin Cap M.D.   On: 12/13/2012 16:12   12/13/2012   CLINICAL DATA:  Screening. The patient presents for stereotactic guided core biopsy of calcifications in the upper central portion of the right breast.  EXAM: STEREOTACTIC VACUUM ASSIST RIGHT  COMPARISON:  11/21/2012, 12/12/2012  FINDINGS: I met with the patient and we discussed the procedure of stereotactic-guided biopsy, including benefits and alternatives. We discussed the high likelihood of a successful procedure. We discussed the risks of the procedure, including infection, bleeding, tissue injury, clip migration, and inadequate sampling. Informed, written consent was given. The usual time out protocol was performed immediately prior to the procedure.  Using sterile technique and 2% Lidocaine as local anesthetic, under stereotactic guidance, a 12 gauge vacuum assisted device was used to perform core needle biopsy of calcifications in the upper central portion of the right breast using a cephalad approach. Specimen radiograph was performed, showing calcifications to be present. Specimens with calcifications are identified for pathology.  At the conclusion of the procedure, a top hat shaped tissue marker clip was deployed into the biopsy cavity. Follow-up 2-view mammogram confirmed clipin expected location.  IMPRESSION: Stereotactic-guided biopsy of right breast calcifications. No apparent complications.  Electronically Signed: By: Rosalie Gums M.D. On: 12/12/2012 16:37     LABS:    Chemistry      Component Value Date/Time   NA 142 12/18/2012 0827   K 4.3 12/18/2012 0827   CO2 28 12/18/2012 0827   BUN 14.4 12/18/2012 0827   CREATININE 1.0 12/18/2012 0827      Component Value Date/Time   CALCIUM 10.1 12/18/2012 0827   ALKPHOS 84  12/18/2012 0827   AST 26 12/18/2012 0827   ALT 25 12/18/2012 0827   BILITOT 1.12 12/18/2012 0827      Lab Results  Component Value Date   WBC 5.3 12/18/2012   HGB 13.5 12/25/2012   HCT 41.8 12/18/2012   MCV 92.0 12/18/2012   PLT 297 12/18/2012   PATHOLOGY: Diagnosis Breast, right, needle core biopsy, upper central right breast - DUCTAL CARCINOMA IN SITU WITH CALCIFICATIONS. - SEE COMMENT. Microscopic Comment The carcinoma is high grade. Estrogen receptor and progesterone receptor studies will be performed and the results reported separately. The results are called to [facility] on 12/13/12. (JBK:gt, 12/13/12) Pecola Leisure MD Pathologist, Electronic Signature (Case signed 12/13/2012) Specimen Gross and Clinical Information   ASSESSMENT     62 year old female with  #1 right DCIS s/p lumpectomy, er/ep positive.  #2 seen by RT and she will have radiation in [place]  3. She will begin tamoxifen after completion of RT   PLAN: 1. Proceed with RT  2. RTC in 2 months    Discussion: Patient  is being treated per NCCN breast cancer care guidelines appropriate for stage.0   Thank you so much for allowing me to participate in the care of Vilda Zollner. I will continue to follow up the patient with you and assist in her care.  All questions were answered. The patient knows to call the clinic with any problems, questions or concerns. We can certainly see the patient much sooner if necessary.  I spent 15 minutes counseling the patient face to face. The total time spent in the appointment was 30 minutes.  Drue Second, MD Medical/Oncology [facility] [phone] (beeper) [phone] (Office)  01/17/2013, 3:27 PM

## 2013-01-17 NOTE — Telephone Encounter (Signed)
, °

## 2013-01-24 NOTE — Addendum Note (Signed)
Encounter addended by: Zakyah Yanes Mintz Charice Zuno, RN on: 01/24/2013  3:13 PM<BR>     Documentation filed: Charges VN

## 2013-02-06 ENCOUNTER — Ambulatory Visit: Payer: Self-pay | Admitting: Radiation Oncology

## 2013-02-07 LAB — CBC CANCER CENTER
BASOS ABS: 0.1 x10 3/mm (ref 0.0–0.1)
Basophil %: 0.9 %
Eosinophil #: 0.1 x10 3/mm (ref 0.0–0.7)
Eosinophil %: 2.1 %
HCT: 40.3 % (ref 35.0–47.0)
HGB: 13.3 g/dL (ref 12.0–16.0)
Lymphocyte #: 1.2 x10 3/mm (ref 1.0–3.6)
Lymphocyte %: 21.9 %
MCH: 30.5 pg (ref 26.0–34.0)
MCHC: 33 g/dL (ref 32.0–36.0)
MCV: 92 fL (ref 80–100)
MONOS PCT: 8.1 %
Monocyte #: 0.5 x10 3/mm (ref 0.2–0.9)
NEUTROS ABS: 3.8 x10 3/mm (ref 1.4–6.5)
Neutrophil %: 67 %
Platelet: 291 x10 3/mm (ref 150–440)
RBC: 4.37 10*6/uL (ref 3.80–5.20)
RDW: 13 % (ref 11.5–14.5)
WBC: 5.6 x10 3/mm (ref 3.6–11.0)

## 2013-02-14 LAB — CBC CANCER CENTER
BASOS PCT: 1.2 %
Basophil #: 0 x10 3/mm (ref 0.0–0.1)
EOS PCT: 2 %
Eosinophil #: 0.1 x10 3/mm (ref 0.0–0.7)
HCT: 39.7 % (ref 35.0–47.0)
HGB: 13.9 g/dL (ref 12.0–16.0)
LYMPHS ABS: 0.9 x10 3/mm — AB (ref 1.0–3.6)
Lymphocyte %: 21.1 %
MCH: 32 pg (ref 26.0–34.0)
MCHC: 35.1 g/dL (ref 32.0–36.0)
MCV: 91 fL (ref 80–100)
MONOS PCT: 7.5 %
Monocyte #: 0.3 x10 3/mm (ref 0.2–0.9)
NEUTROS ABS: 2.8 x10 3/mm (ref 1.4–6.5)
Neutrophil %: 68.2 %
PLATELETS: 274 x10 3/mm (ref 150–440)
RBC: 4.35 10*6/uL (ref 3.80–5.20)
RDW: 13 % (ref 11.5–14.5)
WBC: 4.1 x10 3/mm (ref 3.6–11.0)

## 2013-02-21 LAB — CBC CANCER CENTER
Basophil #: 0.1 x10 3/mm (ref 0.0–0.1)
Basophil %: 1.1 %
EOS PCT: 1.6 %
Eosinophil #: 0.1 x10 3/mm (ref 0.0–0.7)
HCT: 41.3 % (ref 35.0–47.0)
HGB: 13.8 g/dL (ref 12.0–16.0)
Lymphocyte #: 0.9 x10 3/mm — ABNORMAL LOW (ref 1.0–3.6)
Lymphocyte %: 18.8 %
MCH: 30.8 pg (ref 26.0–34.0)
MCHC: 33.4 g/dL (ref 32.0–36.0)
MCV: 92 fL (ref 80–100)
MONOS PCT: 8.5 %
Monocyte #: 0.4 x10 3/mm (ref 0.2–0.9)
Neutrophil #: 3.2 x10 3/mm (ref 1.4–6.5)
Neutrophil %: 70 %
PLATELETS: 284 x10 3/mm (ref 150–440)
RBC: 4.47 10*6/uL (ref 3.80–5.20)
RDW: 12.9 % (ref 11.5–14.5)
WBC: 4.6 x10 3/mm (ref 3.6–11.0)

## 2013-02-28 LAB — CBC CANCER CENTER
BASOS ABS: 0 x10 3/mm (ref 0.0–0.1)
Basophil %: 0.9 %
EOS ABS: 0.1 x10 3/mm (ref 0.0–0.7)
EOS PCT: 1.9 %
HCT: 40.2 % (ref 35.0–47.0)
HGB: 13.4 g/dL (ref 12.0–16.0)
Lymphocyte #: 1 x10 3/mm (ref 1.0–3.6)
Lymphocyte %: 20.1 %
MCH: 30.8 pg (ref 26.0–34.0)
MCHC: 33.2 g/dL (ref 32.0–36.0)
MCV: 93 fL (ref 80–100)
Monocyte #: 0.4 x10 3/mm (ref 0.2–0.9)
Monocyte %: 7.9 %
Neutrophil #: 3.3 x10 3/mm (ref 1.4–6.5)
Neutrophil %: 69.2 %
Platelet: 271 x10 3/mm (ref 150–440)
RBC: 4.34 10*6/uL (ref 3.80–5.20)
RDW: 13 % (ref 11.5–14.5)
WBC: 4.8 x10 3/mm (ref 3.6–11.0)

## 2013-03-07 LAB — CBC CANCER CENTER
Basophil #: 0 x10 3/mm (ref 0.0–0.1)
Basophil %: 1 %
EOS PCT: 1.8 %
Eosinophil #: 0.1 x10 3/mm (ref 0.0–0.7)
HCT: 38.4 % (ref 35.0–47.0)
HGB: 12.6 g/dL (ref 12.0–16.0)
Lymphocyte #: 0.9 x10 3/mm — ABNORMAL LOW (ref 1.0–3.6)
Lymphocyte %: 21.1 %
MCH: 30.6 pg (ref 26.0–34.0)
MCHC: 32.9 g/dL (ref 32.0–36.0)
MCV: 93 fL (ref 80–100)
MONO ABS: 0.5 x10 3/mm (ref 0.2–0.9)
MONOS PCT: 11 %
NEUTROS PCT: 65.1 %
Neutrophil #: 2.7 x10 3/mm (ref 1.4–6.5)
Platelet: 249 x10 3/mm (ref 150–440)
RBC: 4.13 10*6/uL (ref 3.80–5.20)
RDW: 13.2 % (ref 11.5–14.5)
WBC: 4.2 x10 3/mm (ref 3.6–11.0)

## 2013-03-09 ENCOUNTER — Ambulatory Visit: Payer: Self-pay | Admitting: Radiation Oncology

## 2013-03-14 LAB — CBC CANCER CENTER
BASOS ABS: 0 x10 3/mm (ref 0.0–0.1)
BASOS PCT: 0.9 %
EOS ABS: 0.1 x10 3/mm (ref 0.0–0.7)
EOS PCT: 1.7 %
HCT: 39.5 % (ref 35.0–47.0)
HGB: 13.7 g/dL (ref 12.0–16.0)
LYMPHS ABS: 0.7 x10 3/mm — AB (ref 1.0–3.6)
LYMPHS PCT: 15.3 %
MCH: 32.4 pg (ref 26.0–34.0)
MCHC: 34.7 g/dL (ref 32.0–36.0)
MCV: 93 fL (ref 80–100)
MONO ABS: 0.3 x10 3/mm (ref 0.2–0.9)
MONOS PCT: 8 %
NEUTROS ABS: 3.2 x10 3/mm (ref 1.4–6.5)
NEUTROS PCT: 74.1 %
Platelet: 255 x10 3/mm (ref 150–440)
RBC: 4.24 10*6/uL (ref 3.80–5.20)
RDW: 13.3 % (ref 11.5–14.5)
WBC: 4.4 x10 3/mm (ref 3.6–11.0)

## 2013-04-06 ENCOUNTER — Ambulatory Visit: Payer: Self-pay | Admitting: Radiation Oncology

## 2013-04-17 ENCOUNTER — Other Ambulatory Visit: Payer: Self-pay

## 2013-04-17 DIAGNOSIS — C50819 Malignant neoplasm of overlapping sites of unspecified female breast: Secondary | ICD-10-CM

## 2013-04-18 ENCOUNTER — Encounter: Payer: Self-pay | Admitting: Oncology

## 2013-04-18 ENCOUNTER — Other Ambulatory Visit (HOSPITAL_BASED_OUTPATIENT_CLINIC_OR_DEPARTMENT_OTHER): Payer: 59

## 2013-04-18 ENCOUNTER — Ambulatory Visit (HOSPITAL_BASED_OUTPATIENT_CLINIC_OR_DEPARTMENT_OTHER): Payer: 59 | Admitting: Oncology

## 2013-04-18 ENCOUNTER — Telehealth: Payer: Self-pay | Admitting: *Deleted

## 2013-04-18 VITALS — BP 116/78 | HR 87 | Temp 97.9°F | Resp 20 | Ht 67.0 in | Wt 135.2 lb

## 2013-04-18 DIAGNOSIS — C50819 Malignant neoplasm of overlapping sites of unspecified female breast: Secondary | ICD-10-CM

## 2013-04-18 DIAGNOSIS — D0590 Unspecified type of carcinoma in situ of unspecified breast: Secondary | ICD-10-CM

## 2013-04-18 DIAGNOSIS — Z170 Estrogen receptor positive status [ER+]: Secondary | ICD-10-CM

## 2013-04-18 LAB — CBC WITH DIFFERENTIAL/PLATELET
BASO%: 0.9 % (ref 0.0–2.0)
BASOS ABS: 0.1 10*3/uL (ref 0.0–0.1)
EOS%: 1.2 % (ref 0.0–7.0)
Eosinophils Absolute: 0.1 10*3/uL (ref 0.0–0.5)
HCT: 40.9 % (ref 34.8–46.6)
HGB: 13.9 g/dL (ref 11.6–15.9)
LYMPH%: 16.3 % (ref 14.0–49.7)
MCH: 31.6 pg (ref 25.1–34.0)
MCHC: 33.9 g/dL (ref 31.5–36.0)
MCV: 93.3 fL (ref 79.5–101.0)
MONO#: 0.4 10*3/uL (ref 0.1–0.9)
MONO%: 7.2 % (ref 0.0–14.0)
NEUT#: 4.6 10*3/uL (ref 1.5–6.5)
NEUT%: 74.4 % (ref 38.4–76.8)
PLATELETS: 310 10*3/uL (ref 145–400)
RBC: 4.38 10*6/uL (ref 3.70–5.45)
RDW: 13 % (ref 11.2–14.5)
WBC: 6.2 10*3/uL (ref 3.9–10.3)
lymph#: 1 10*3/uL (ref 0.9–3.3)

## 2013-04-18 LAB — COMPREHENSIVE METABOLIC PANEL (CC13)
ALBUMIN: 4.2 g/dL (ref 3.5–5.0)
ALK PHOS: 113 U/L (ref 40–150)
ALT: 44 U/L (ref 0–55)
AST: 35 U/L — AB (ref 5–34)
Anion Gap: 12 mEq/L — ABNORMAL HIGH (ref 3–11)
BUN: 13 mg/dL (ref 7.0–26.0)
CO2: 27 mEq/L (ref 22–29)
Calcium: 10.1 mg/dL (ref 8.4–10.4)
Chloride: 104 mEq/L (ref 98–109)
Creatinine: 0.9 mg/dL (ref 0.6–1.1)
Glucose: 72 mg/dl (ref 70–140)
POTASSIUM: 4.1 meq/L (ref 3.5–5.1)
Sodium: 143 mEq/L (ref 136–145)
Total Bilirubin: 0.61 mg/dL (ref 0.20–1.20)
Total Protein: 8.1 g/dL (ref 6.4–8.3)

## 2013-04-18 MED ORDER — TAMOXIFEN CITRATE 20 MG PO TABS: 20.0000 mg | ORAL_TABLET | Freq: Every day | ORAL | Status: AC

## 2013-04-18 NOTE — Progress Notes (Signed)
Ana Craig [id number] October 02, 1950 62 y.o. 04/18/2013 10:19 AM  CC Dr. Lorenda Hatchet Dr. Excell Seltzer Dr. Thea Silversmith  Diagnosis:  63 year old female with new diagnosis of right breast cancer (DCIS) patient is seen in the multidisciplinary breast clinic for discussion of treatment options.  STAGE:   Cancer of midline of female breast   Primary site: Breast (Right)   Staging method: AJCC 7th Edition   Clinical: Stage 0 (Tis (DCIS), N0, cM0)   Summary: Stage 0 (Tis (DCIS), N0, cM0)  REFERRING PHYSICIAN: Dr. Excell Seltzer  Past oncologic history :  Ana Craig is a 63 y.o. female.  Would medical history significant for osteoporosis and hypothyroidism and arthritis.  1.Patient underwent bilateral screening mammogram on 11/21/2012. The mammogram revealed calcifications and a possible mass. She was recommended further evaluation of the right breast. In the left breast no masses or malignant type calcifications are identified. On 12/12/2012 patient had a right diagnostic mammogram performed this did not show a definite nodule in the central aspect of the right breast on magnification views there was noted to be group of calcifications in the upper central portion of the breasts. These measure 5 mm in diameter. Because of this she was recommended ultrasound which showed normal appearing common dense fibroglandular tissue no mass or distortion or acoustic shadowing. 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 Tumor was ER positive PR negative. Her MRI is pending. Patient is without any complaints related to her breast. Her case was discussed at the multidisciplinary breast conference this morning and she is now seen in the multidisciplinary breast clinic for discussion of further treatment options.  2. S/p lumpectomy on 12/25/12, pathology: Diagnosis 1. Breast, lumpectomy, Right - HIGH  GRADE DUCTAL CARCINOMA IN SITU WITH NECROSIS AND ASSOCIATED CALCIFICATIONS INVOLVING APPROXIMATELY 2 CM WORTH OF BREAST PARENCHYMA. - MARGINS ARE NEGATIVE. - SEE ONCOLOGY TEMPLATE. 2. Breast, excision, Right inferior margin - BENIGN BREAST PARENCHYMA WITH FIBROCYSTIC CHANGES AND FAT NECROSIS. - NO TUMOR SEEN.  #3 status post completion of radiation therapy in [place].  #4 adjuvant curative intent tamoxifen 20 mg daily starting 04/18/2013. Total of 5 years of therapy is planned  Interval history: Patient is seen in followup today overall she's doing well. She's tolerated the radiation very nicely. Her breast is healing very well. She has a minimal erythema. No palpable masses no nipple discharge. Today she denies any headaches double vision blurring of vision fevers chills night sweats. No shortness of breath chest pains palpitations. No abdominal pain no diarrhea or constipation. She has no easy bruising or bleeding. She has no myalgias and arthralgias. No peripheral paresthesias or gait disturbances. Remainder of the 10 point review of systems is negative.  Past Medical History: Past Medical History  Diagnosis Date  . 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 Osteoporosis   . Hypothyroidism   . Arthritis   . PONV (postoperative nausea and vomiting)   . Wears glasses     Past Surgical History: Past Surgical History  Procedure Laterality Date  . Appendectomy  2002    lap  . Abdominal hysterectomy  1998  . Tonsillectomy    . Colonoscopy    . Breast lumpectomy with needle localization Right 12/25/2012    Procedure: BREAST LUMPECTOMY WITH NEEDLE LOCALIZATION;  Surgeon: Edward Jolly, MD;  Location: [facility];  Service: General;  Laterality: Right;    Family History:  Family History  Problem Relation Age of Onset  . COPD Mother     Social History History  Substance Use Topics  . Smoking status: Never Smoker   . Smokeless  tobacco: Not on file  . Alcohol Use: No    Allergies: Allergies  Allergen Reactions  . Penicillins Rash    Current Medications: Current Outpatient Prescriptions  Medication Sig Dispense Refill  . calcium carbonate (OS-CAL) 600 MG TABS tablet Take 600 mg by mouth 2 (two) times daily with a meal.      . cholecalciferol (VITAMIN D) 1000 UNITS tablet Take 1,000 Units by mouth daily.      Marland Kitchen ibuprofen (ADVIL,MOTRIN) 200 MG tablet Take 600 mg by mouth every 6 (six) hours as needed. Taking for back pain      . levothyroxine (SYNTHROID, LEVOTHROID) 75 MCG tablet 75 mcg daily.      . Multiple Vitamins-Minerals (MULTIVITAMIN WITH MINERALS) tablet Take 1 tablet by mouth daily.      . vitamin C (ASCORBIC ACID) 500 MG tablet Take 500 mg by mouth daily.       No current facility-administered medications for this visit.    OB/GYN History: menarche at age 39 patient underwent menopause she did go on hormone replacement therapy she stopped in 1999 she is nulliparous area in she has had use of oral contraceptive pills.  Fertility Discussion:not applicable  Prior History of Cancer: no  Health Maintenance:  Colonoscopy yes  Bone Density yes Last PAP smear unknown   ECOG PERFORMANCE STATUS: 0 - Asymptomatic  Genetic Counseling/testing: no   REVIEW OF SYSTEMS:  A comprehensive review of systems was negative.  PHYSICAL EXAMINATION: Blood pressure 116/78, pulse 87, temperature 97.9 F (36.6 C), temperature source Oral, resp. rate 20, height 5\' 7"  (1.702 m), weight 135 lb 3.2 oz (61.326 kg).  UEA:VWUJW, healthy, no distress, well nourished and well developed SKIN: skin color, texture, turgor are normal HEAD: Normocephalic EYES: normal, PERRLA, EOMI, Conjunctiva are pink and non-injected EARS: External ears normal OROPHARYNX:no exudate, no erythema and lips, buccal mucosa, and tongue normal  NECK: supple, no adenopathy, thyroid normal size, non-tender, without nodularity LYMPH:  no palpable  lymphadenopathy, no hepatosplenomegaly BREAST:left breast normal without mass, skin or nipple changes or axillary nodes, area of ecchymosis is noted in the right breast healed lumpectomy scar resolving erythema from radiation. LUNGS: clear to auscultation and percussion HEART: regular rate & rhythm, no murmurs and no gallops ABDOMEN:abdomen soft, non-tender, normal bowel sounds and no masses or organomegaly BACK: Back symmetric, no curvature., No CVA tenderness, Range of motion is normal EXTREMITIES:no edema, no clubbing, no cyanosis  NEURO: alert & oriented x 3 with fluent speech, no focal motor/sensory deficits, gait normal, reflexes normal and symmetric     LABS:    Chemistry      Component Value Date/Time   NA 143 04/18/2013 0938   K 4.1 04/18/2013 0938   CO2 27 04/18/2013 0938   BUN 13.0 04/18/2013 0938   CREATININE 0.9 04/18/2013 0938      Component Value Date/Time   CALCIUM 10.1 04/18/2013 0938   ALKPHOS 113 04/18/2013 0938   AST 35* 04/18/2013 0938   ALT 44 04/18/2013 0938   BILITOT 0.61 04/18/2013 0938      Lab Results  Component Value Date   WBC 6.2 04/18/2013   HGB 13.9 04/18/2013   HCT 40.9 04/18/2013   MCV 93.3 04/18/2013   PLT 310 04/18/2013   PATHOLOGY: Diagnosis Breast, right, needle core biopsy,  upper central right breast - DUCTAL CARCINOMA IN SITU WITH CALCIFICATIONS. - SEE COMMENT. Microscopic Comment The carcinoma is high grade. Estrogen receptor and progesterone receptor studies will be performed and the results reported separately. The results are called to [facility] on 12/13/12. (JBK:gt, 12/13/12) Enid Cutter MD Pathologist, Electronic Signature (Case signed 12/13/2012) Specimen Gross and Clinical Information   ASSESSMENT/PLAN     63 year old female with  #1 right DCIS s/p lumpectomy, er/ep positive. After lumpectomy patient underwent radiation therapy which he completed in [place]. Overall she's tolerated it well.  #2  recommendation is to proceed with adjuvant curative intent tamoxifen 20 mg daily. We discussed risks benefits and side effects of this. She understands she will be taking it for 5 years.  #3 patient will be seen back in 3 months time for followup.   Thank you so much for allowing me to participate in the care of Darling Cieslewicz. I will continue to follow up the patient with you and assist in her care.  All questions were answered. The patient knows to call the clinic with any problems, questions or concerns. We can certainly see the patient much sooner if necessary.  I spent 15 minutes counseling the patient face to face. The total time spent in the appointment was 30 minutes.  Marcy Panning, MD Medical/Oncology [facility] [phone] (beeper) [phone] (Office)  04/18/2013, 10:19 AM

## 2013-04-18 NOTE — Patient Instructions (Signed)

## 2013-04-18 NOTE — Telephone Encounter (Signed)
appts made and printed...td 

## 2013-04-22 ENCOUNTER — Telehealth: Payer: Self-pay | Admitting: *Deleted

## 2013-04-22 NOTE — Telephone Encounter (Signed)
Received voice mail from patient stating, "Please let Dr. Humphrey Rolls know that I have decided not to start Tamoxifen due to its side effects and my state of health. If she has any questions, feel free to call me."

## 2013-04-24 ENCOUNTER — Telehealth: Payer: Self-pay | Admitting: Oncology

## 2013-04-24 ENCOUNTER — Telehealth: Payer: Self-pay

## 2013-04-24 NOTE — Telephone Encounter (Signed)
, °

## 2013-04-24 NOTE — Telephone Encounter (Signed)
Pt called wanting verification that [place] had been notified that she was not going to take Tamoxifen.  LMOVM that [place] had been notified and call us if she had any questions.

## 2013-07-16 ENCOUNTER — Encounter (INDEPENDENT_AMBULATORY_CARE_PROVIDER_SITE_OTHER): Payer: Self-pay | Admitting: General Surgery

## 2013-07-31 ENCOUNTER — Other Ambulatory Visit (INDEPENDENT_AMBULATORY_CARE_PROVIDER_SITE_OTHER): Payer: Self-pay

## 2013-07-31 ENCOUNTER — Telehealth: Payer: Self-pay | Admitting: Pediatrics

## 2013-07-31 DIAGNOSIS — C50911 Malignant neoplasm of unspecified site of right female breast: Secondary | ICD-10-CM

## 2013-07-31 NOTE — Telephone Encounter (Signed)
Error wrong patient

## 2013-08-22 ENCOUNTER — Other Ambulatory Visit: Payer: 59

## 2013-08-22 ENCOUNTER — Ambulatory Visit: Payer: 59 | Admitting: Oncology

## 2013-09-05 ENCOUNTER — Ambulatory Visit (INDEPENDENT_AMBULATORY_CARE_PROVIDER_SITE_OTHER): Payer: 59 | Admitting: General Surgery

## 2013-12-02 ENCOUNTER — Other Ambulatory Visit (INDEPENDENT_AMBULATORY_CARE_PROVIDER_SITE_OTHER): Payer: Self-pay | Admitting: General Surgery

## 2013-12-02 ENCOUNTER — Ambulatory Visit
Admission: RE | Admit: 2013-12-02 | Discharge: 2013-12-02 | Disposition: A | Discharge status: Home / Self Care | Payer: BC Managed Care – PPO | Source: Ambulatory Visit | Attending: General Surgery | Admitting: General Surgery

## 2013-12-02 DIAGNOSIS — C50911 Malignant neoplasm of unspecified site of right female breast: Secondary | ICD-10-CM

## 2014-02-06 DIAGNOSIS — C4491 Basal cell carcinoma of skin, unspecified: Secondary | ICD-10-CM

## 2014-02-06 HISTORY — DX: Basal cell carcinoma of skin, unspecified: C44.91

## 2014-04-18 IMAGING — MG MM DIGITAL DIAGNOSTIC LIMITED*R*
3 series · 3 of 3 positions shown · non-contrast
Comparison: 11/21/2012

CLINICAL DATA: The patient presents for further evaluation of right
breast calcifications and a possible right breast mass.

EXAM:
DIGITAL DIAGNOSTIC  right MAMMOGRAM
ULTRASOUND right BREAST

[R CC (1 of 2)]
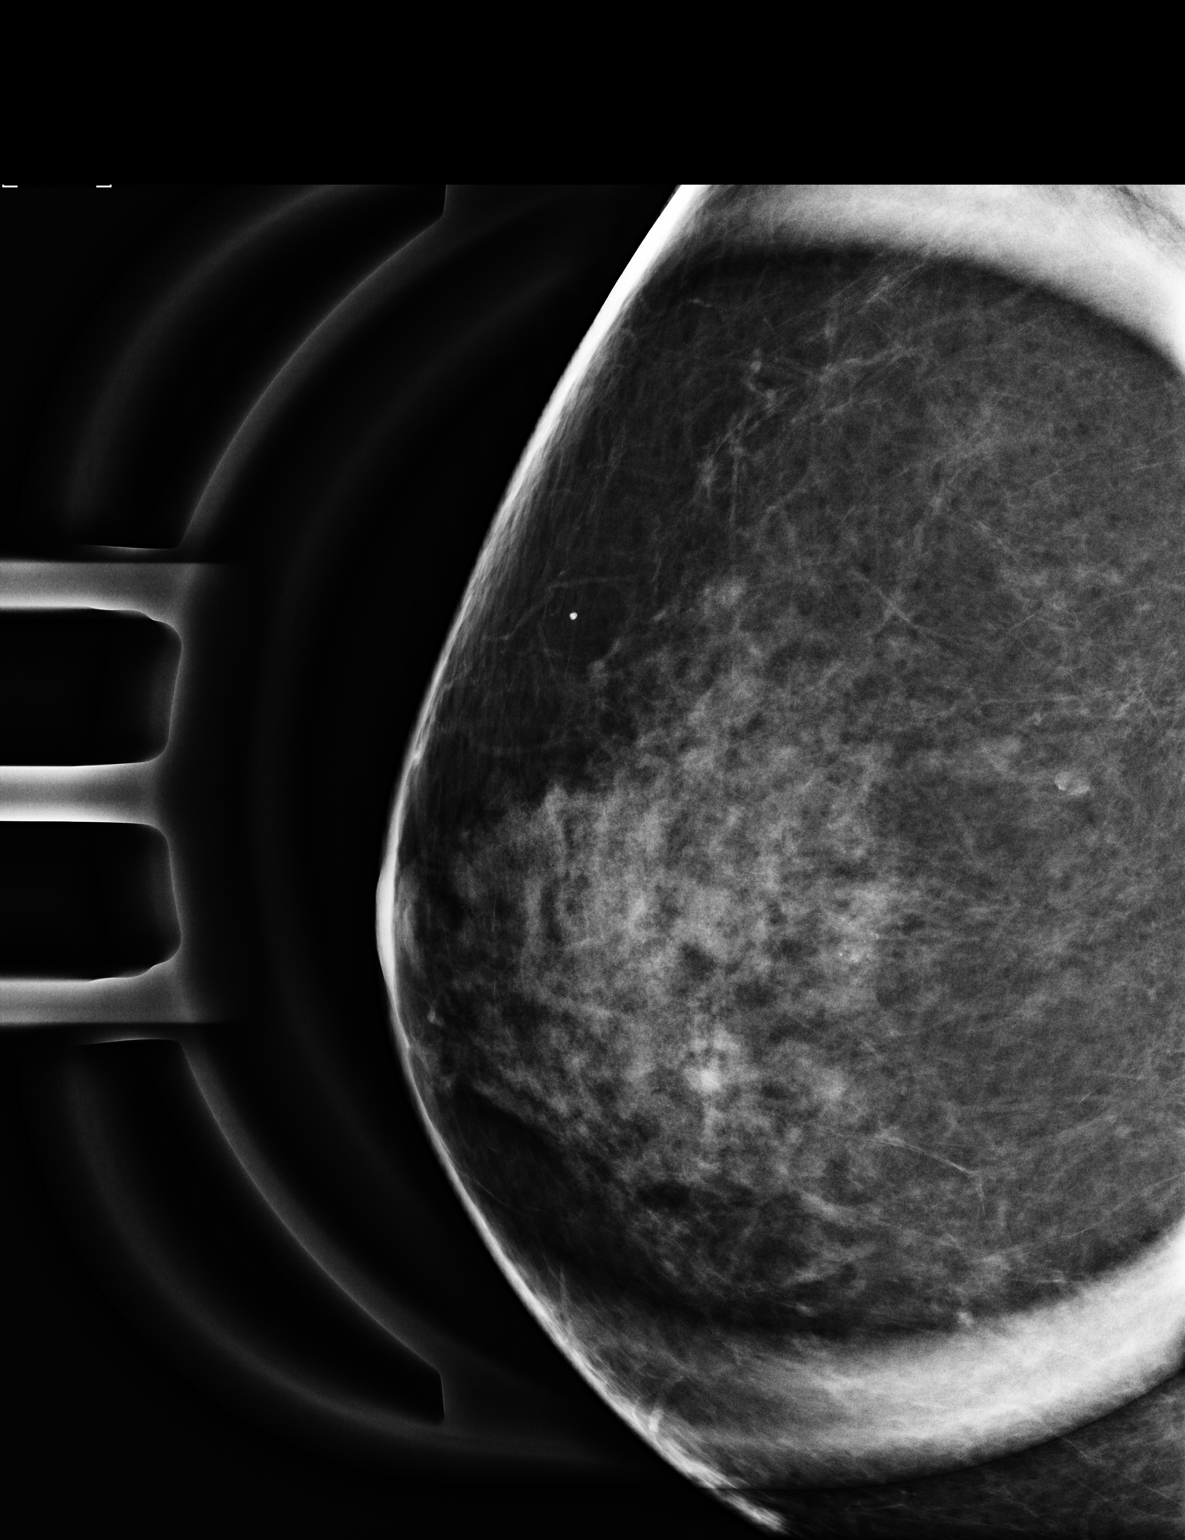

[R ML]
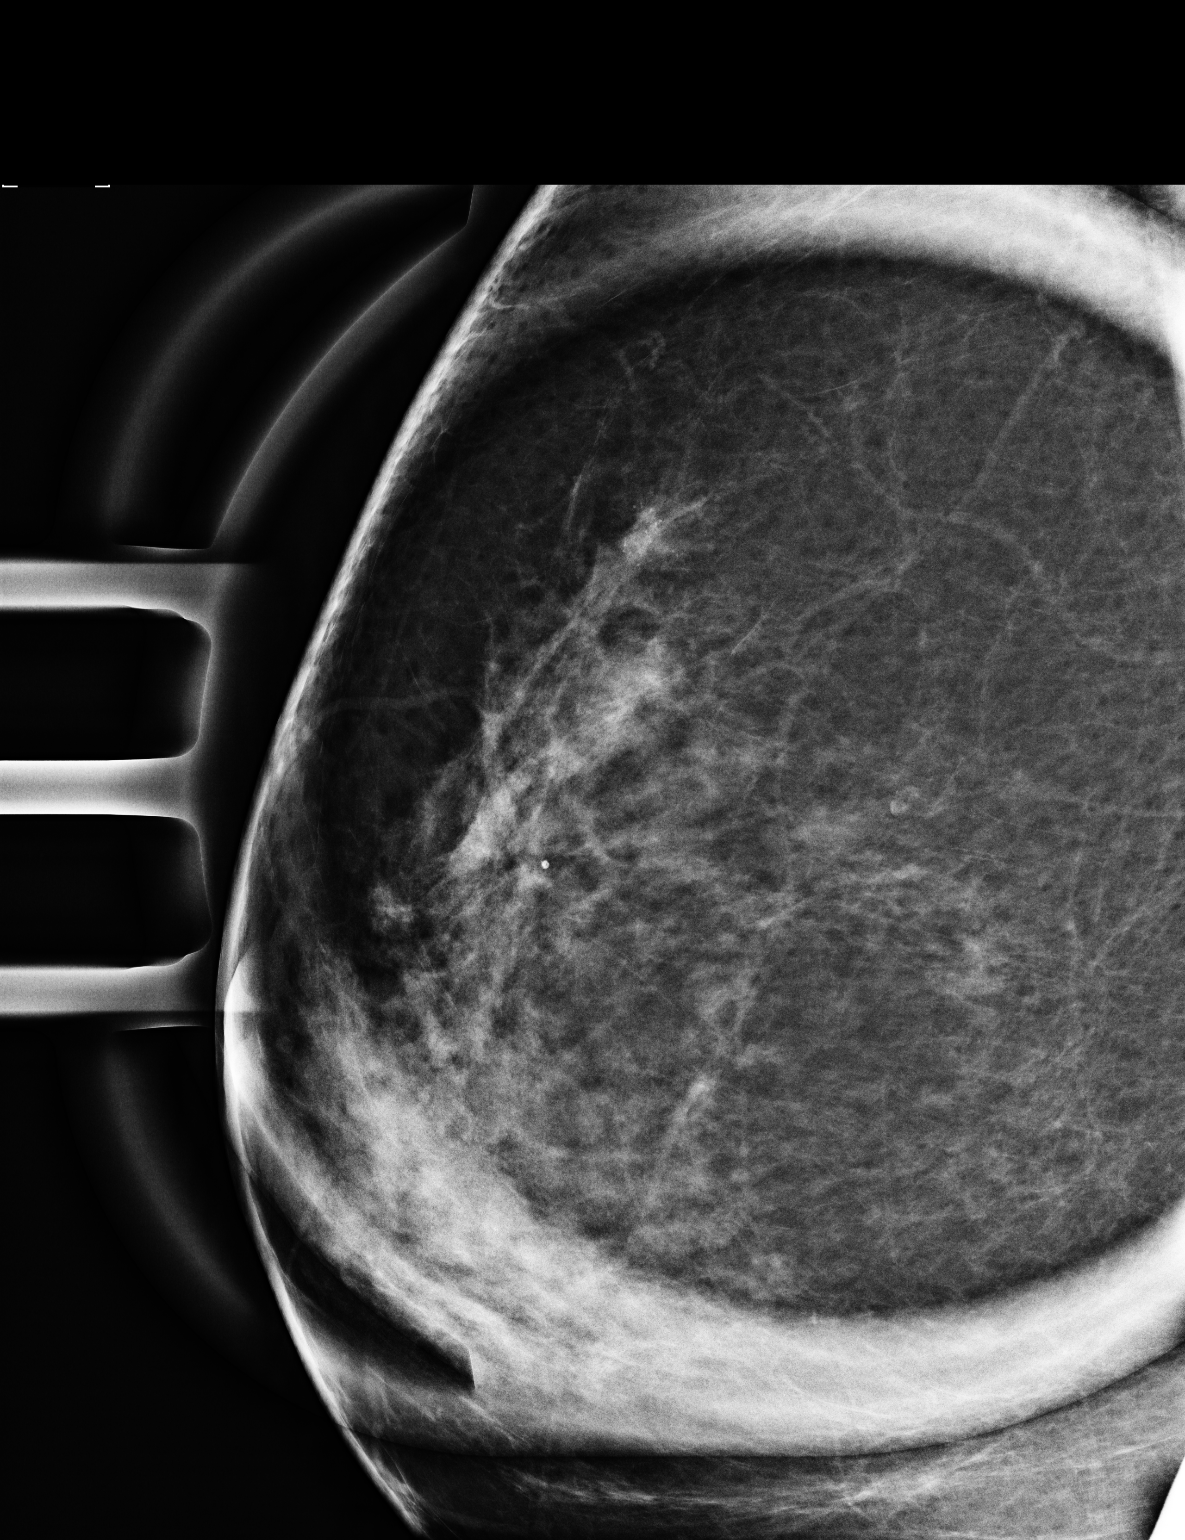

[R CC (2 of 2)]
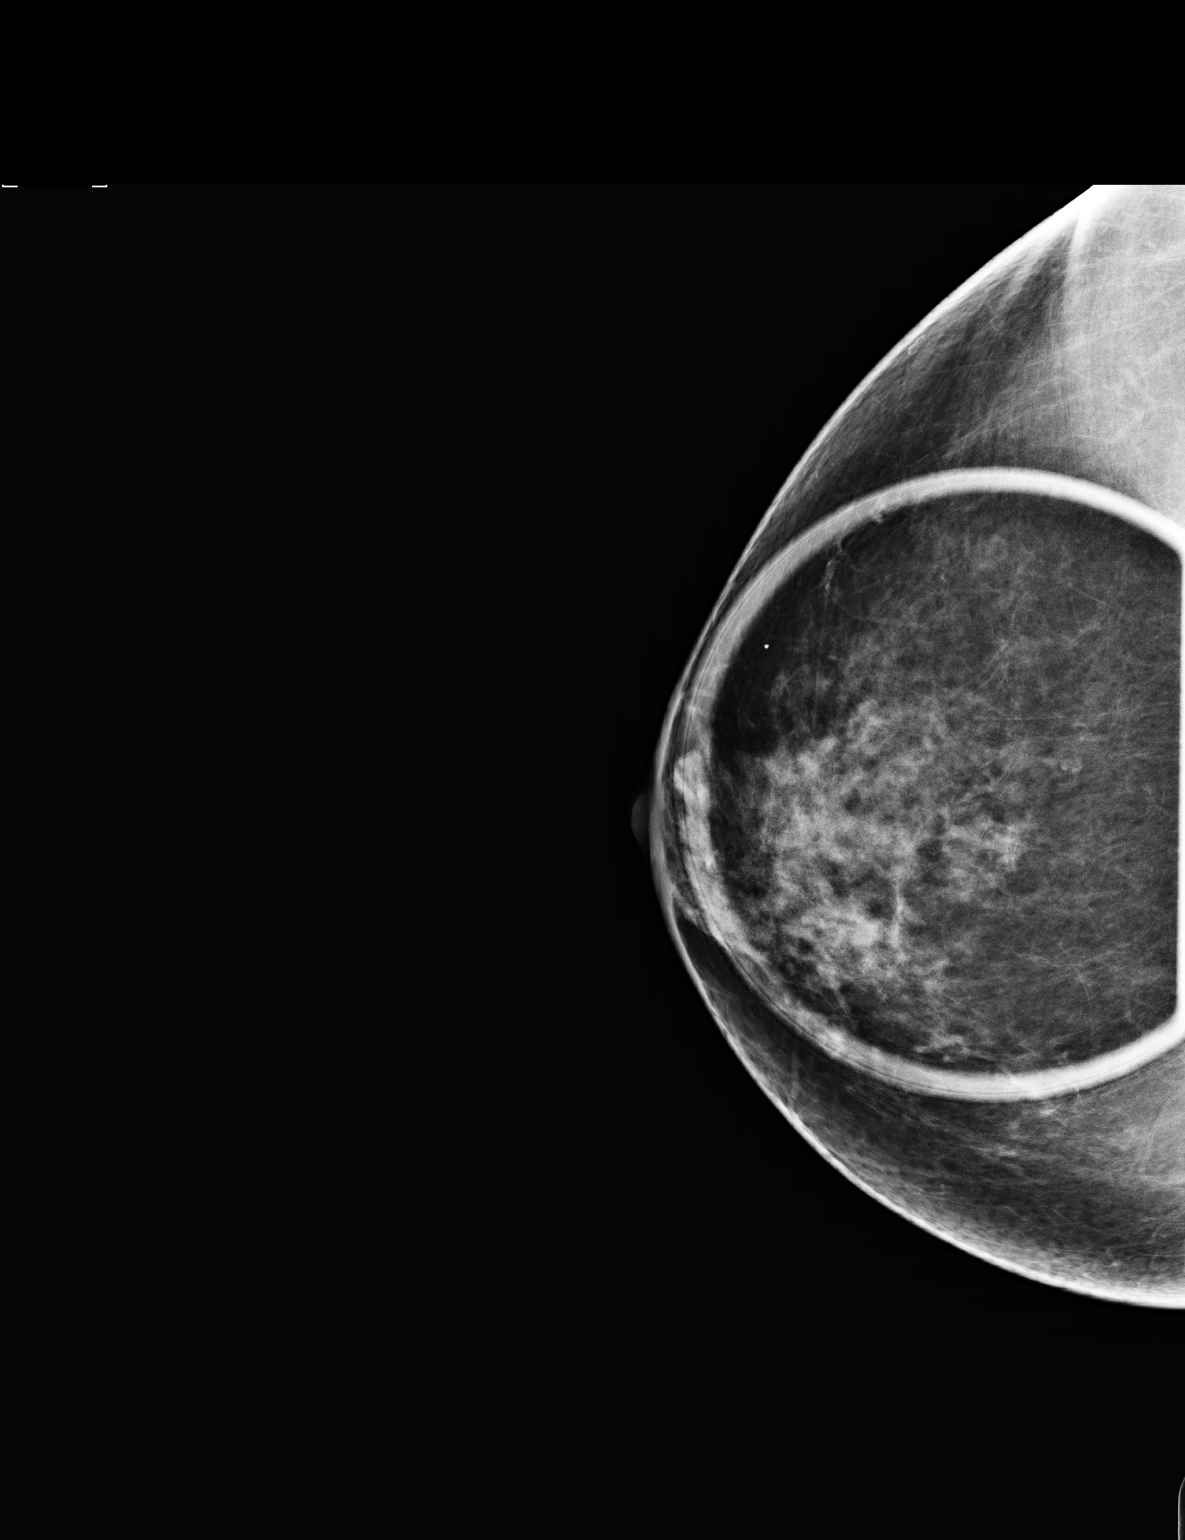

[3 of 3 positions shown; findings below may reference images not displayed]

ACR Breast Density Category c: The breasts are heterogeneously
dense, which may obscure small masses.
FINDINGS: Additional views are performed, showing no definite nodule in the
central aspect of the right breast on spot compression view. On
magnified views, there is a grouping of calcifications in the upper
central portion of the breast. On magnified views, this grouping
measures approximately 5 mm in diameter. Calcifications vary in
size, shape, and density. Biopsy is recommended to exclude ductal
carcinoma in situ.

On physical exam I palpate no abnormality in the central aspect of
the right breast.

Ultrasound is performed, showing normal appearing common dense
fibroglandular tissue in the retroareolar region of the right
breast. No mass, distortion, or acoustic shadowing is demonstrated
with ultrasound.
IMPRESSION: 1. Suspicious microcalcifications in the upper central portion of
the right breast.
2. No persistent mass on further evaluation.

RECOMMENDATION:
Stereotactic guided core biopsy is recommended for right breast
calcifications. This will be performed later today at the request of
the patient.

I have discussed the findings and recommendations with the patient.
Results were also provided in writing at the conclusion of the
visit. If applicable, a reminder letter will be sent to the patient
regarding the next appointment.

BI-RADS CATEGORY  4: Suspicious abnormality - biopsy should be
considered.

## 2014-05-29 NOTE — Consult Note (Signed)
Reason for Visit: This 64 year old Female patient presents to the clinic for initial evaluation of  Breast cancer .   Referred by Dr. Excell Seltzer.  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 Seen by Dr. 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 Margins were negative. Patient has been seen by radiation oncology in [facility] and recommendation for postop whole breast radiation was made. They have also ordered a postop mammogram will be performed this week. Patient is also scheduled to see medical oncology for consideration of aromatase inhibitor presumably tamoxifen.she is currently doing well and is without complaint. She specifically denies breast tenderness cough or bone pain.  Past Hx:    Arthritis:    Osteoporosis:    Sinus Surgery:    Appendectomy:    Hysterectomy:    DCIS Right Breast:   Past, Family and Social History:  Past Medical History positive   Past Surgical History appendectomy; hysterectomy, sinus surgery   Past Medical History Comments osteoporosis and arthritis   Family History noncontributory   Social History noncontributory   Additional Past Medical and Surgical History een by herself today   Allergies:   Penicillin: Rash  Codeine:  Other  Home Meds:  Home Medications: Medication Instructions Status  Os-Cal 600 mg. 1 tab(s) orally 2 times a day Active  Vitamin D3 1000 intl units oral capsule 1 cap(s) orally once a day Active  ibuprofen 200 mg oral capsule 3 tab(s) orally every 6 hours, As Needed - for Pain Active  levothyroxine 75 mcg (0.075 mg) oral capsule 1 cap(s) orally once a day Active  Multiple Vitamains with Minerals 1 tab(s) orally once a day Active  Vitamin C 500 mg oral tablet 1 tab(s) orally once a day Active   Review of Systems:  General negative   Performance Status (ECOG) 0   Skin negative   Breast see HPI   Ophthalmologic negative   ENMT negative   Respiratory and Thorax negative   Cardiovascular negative   Gastrointestinal negative   Genitourinary negative   Musculoskeletal negative   Neurological negative   Psychiatric negative   Hematology/Lymphatics negative   Endocrine negative   Allergic/Immunologic negative   Review of Systems   review of systems obtained from nurse's notes  Nursing Notes:  Nursing Vital Signs and Chemo Nursing Nursing Notes: *CC Vital Signs Flowsheet:   08-Dec-14 09:37  Temp Temperature 98.6  Pulse Pulse 87  Respirations Respirations 20  SBP SBP 117  DBP DBP 79  Pain Scale (0-10)  0  Current Weight (kg) (kg) 77.2  Height (cm) centimeters 164.6  BSA (m2) 1.8   Physical Exam:  General/Skin/HEENT:  General normal   Skin normal   Eyes normal   ENMT normal   Head and Neck normal   Additional PE well-developed well-nourished female in NAD. She status post wide local excision of  the right breast in the 12:00 position. Incision is healing well. No dominant mass or nodularity is noted in either breast into position examined. No axillary or supraclavicular adenopathy is identified. Lungs are clear to A&P cardiac examination shows regular rate and rhythm.   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 Also boost or scar another 1400 cGy using electron beam. Risks and benefits of treatment including skin reactions, fatigue, possible alteration of blood counts and possible inclusion of some superficial lung all were discussed in detail with the patient. She seems to comprehend my treatment plan well. I have set her up for CT simulation later this week. She has a postop mammogram being performed on Thursday will review that prior to commencing with her treatments.  I would like to take this opportunity to thank you for allowing me to continue to participate in this patient's care.  CC Referral:  cc: Dr. Hebert Soho Hoxworth, Dr. Thea Silversmith, Dr.Husain   Electronic Signatures: Baruch Gouty, Roda Shutters (MD)  (Signed 08-Dec-14 10:52)  Authored: HPI, Diagnosis, Past Hx, PFSH, Allergies, Home Meds, ROS, Nursing Notes, Physical Exam, Relevent Results, Encounter Assessment and Plan, CC Referring Physician   Last Updated: 08-Dec-14 10:52 by Armstead Peaks (MD)

## 2014-07-08 ENCOUNTER — Ambulatory Visit: Payer: Self-pay | Admitting: Nurse Practitioner

## 2014-07-17 ENCOUNTER — Ambulatory Visit (INDEPENDENT_AMBULATORY_CARE_PROVIDER_SITE_OTHER): Payer: BLUE CROSS/BLUE SHIELD | Admitting: Nurse Practitioner

## 2014-07-17 ENCOUNTER — Encounter: Payer: Self-pay | Admitting: Nurse Practitioner

## 2014-07-17 VITALS — BP 110/80 | HR 66 | Temp 97.8°F | Resp 16 | Ht 66.0 in | Wt 137.1 lb

## 2014-07-17 DIAGNOSIS — R7309 Other abnormal glucose: Secondary | ICD-10-CM | POA: Diagnosis not present

## 2014-07-17 DIAGNOSIS — Z7689 Persons encountering health services in other specified circumstances: Secondary | ICD-10-CM

## 2014-07-17 DIAGNOSIS — L589 Radiodermatitis, unspecified: Secondary | ICD-10-CM

## 2014-07-17 DIAGNOSIS — Z7189 Other specified counseling: Secondary | ICD-10-CM

## 2014-07-17 DIAGNOSIS — E039 Hypothyroidism, unspecified: Secondary | ICD-10-CM | POA: Diagnosis not present

## 2014-07-17 LAB — T4, FREE: FREE T4: 0.95 ng/dL (ref 0.60–1.60)

## 2014-07-17 LAB — TSH: TSH: 0.85 u[IU]/mL (ref 0.35–4.50)

## 2014-07-17 NOTE — Patient Instructions (Signed)
Please visit our lab today.   Call us if you need [place] or we will see you for physical AFTER 11/06/14.   Welcome to [facility]!

## 2014-07-17 NOTE — Progress Notes (Signed)
Subjective:    Patient ID: Ana Craig, female    DOB: 1950-09-29, 64 y.o.   MRN: [id number]  HPI  Ana Craig is a 64 yo female here to establish care today and CC of Rash.  1) New pt info:   Immunizations- Zostavax and pneumonia 2015    Mammogram- 2015 Oct.   Pap- 2015 Oct.   Bone Density- 2014   Colonoscopy-2007   Eye Exam- every 6 mos; suspect for glaucoma, normal pressures, keeping close watch   Dental Exam- UTD, goes 3 times a year   2) Chronic Problems-  Breast Cancer- Right Ductal 12/13/12 surgery and radiation   [facility]  Issues with synthroid- [facility] was helping her out   Brand only 75 mcg daily    3) Acute Problems-  Rash- popped up after radiation, noticed blood on shirt recently, small, denies growth, denies change in color or texture.     Review of Systems  Constitutional: Negative for fever, chills, diaphoresis and fatigue.  HENT: Negative for tinnitus and trouble swallowing.   Eyes: Negative for visual disturbance.  Respiratory: Negative for chest tightness, shortness of breath and wheezing.   Cardiovascular: Negative for chest pain, palpitations and leg swelling.  Gastrointestinal: Negative for nausea, vomiting, diarrhea and constipation.  Genitourinary: Negative for difficulty urinating.  Musculoskeletal: Negative for back pain and neck pain.  Skin: Positive for rash.  Neurological: Negative for dizziness, weakness, numbness and headaches.  Psychiatric/Behavioral: Negative for suicidal ideas and sleep disturbance. The patient is not nervous/anxious.    Past Medical History  Diagnosis Date  . 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 Osteoporosis   . Hypothyroidism   . Arthritis   . PONV (postoperative nausea and vomiting)   . Wears glasses     History   Social History  . Marital Status: Married    Spouse Name: N/A  . Number of Children: N/A  . Years of Education: N/A   Occupational History  . Not on file.    Social History Main Topics  . Smoking status: Never Smoker   . Smokeless tobacco: Not on file  . Alcohol Use: No  . Drug Use: No  . Sexual Activity: Yes   Other Topics Concern  . Not on file   Social History Narrative    Past Surgical History  Procedure Laterality Date  . Appendectomy  2002    lap  . Abdominal hysterectomy  1998  . Tonsillectomy    . Colonoscopy    . Breast lumpectomy with needle localization Right 12/25/2012    Procedure: BREAST LUMPECTOMY WITH NEEDLE LOCALIZATION;  Surgeon: Edward Jolly, MD;  Location: [place];  Service: General;  Laterality: Right;    Family History  Problem Relation Age of Onset  . COPD Mother     Allergies  Allergen Reactions  . Penicillins Rash    Current Outpatient Prescriptions on File Prior to Visit  Medication Sig Dispense Refill  . calcium carbonate (OS-CAL) 600 MG TABS tablet Take 600 mg by mouth 2 (two) times daily with a meal.    . cholecalciferol (VITAMIN D) 1000 UNITS tablet Take 1,000 Units by mouth daily.    Marland Kitchen ibuprofen (ADVIL,MOTRIN) 200 MG tablet Take 600 mg by mouth every 6 (six) hours as needed. Taking for back pain    . levothyroxine (SYNTHROID, LEVOTHROID) 75 MCG tablet 75 mcg daily.    . Multiple Vitamins-Minerals (MULTIVITAMIN WITH MINERALS) tablet Take 1  tablet by mouth daily.    . vitamin C (ASCORBIC ACID) 500 MG tablet Take 500 mg by mouth daily.     No current facility-administered medications on file prior to visit.      Objective:   Physical Exam  Constitutional: She is oriented to person, place, and time. She appears well-developed and well-nourished. No distress.  BP 110/80 mmHg  Pulse 66  Temp(Src) 97.8 F (36.6 C)  Resp 16  Ht 5\' 6"  (1.676 m)  Wt 137 lb 1.9 oz (62.197 kg)  BMI 22.14 kg/m2  SpO2 98%   HENT:  Head: Normocephalic and atraumatic.  Right Ear: External ear normal.  Left Ear: External ear normal.  Eyes: EOM are normal. Pupils are equal, round, and  reactive to light. Right eye exhibits no discharge. Left eye exhibits no discharge. No scleral icterus.  Neck: Normal range of motion. Neck supple.  Cardiovascular: Normal rate and regular rhythm.   Pulmonary/Chest: Effort normal and breath sounds normal. No respiratory distress. She has no wheezes. She has no rales. She exhibits no tenderness.  Neurological: She is alert and oriented to person, place, and time. No cranial nerve deficit. She exhibits normal muscle tone. Coordination normal.  Skin: Skin is warm and dry. Rash noted. She is not diaphoretic.     Psychiatric: She has a normal mood and affect. Her behavior is normal. Judgment and thought content normal.      Assessment & Plan:

## 2014-07-17 NOTE — Progress Notes (Signed)
Pre visit review using our clinic review tool, if applicable. No additional management support is needed unless otherwise documented below in the visit note. 

## 2014-07-26 DIAGNOSIS — Z7689 Persons encountering health services in other specified circumstances: Secondary | ICD-10-CM | POA: Insufficient documentation

## 2014-07-26 DIAGNOSIS — L589 Radiodermatitis, unspecified: Secondary | ICD-10-CM | POA: Insufficient documentation

## 2014-07-26 NOTE — Assessment & Plan Note (Signed)
Check a T4-free and a TSH. Will follow.

## 2014-07-26 NOTE — Assessment & Plan Note (Signed)
Discussed acute and chronic issues. Reviewed health maintenance measures, PFSHx, and immunizations. Obtain records from previous facility.   

## 2014-07-26 NOTE — Assessment & Plan Note (Signed)
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 Small bleeding area, not actively bleeding. Referral to dermatology placed at request of pt.

## 2014-09-24 ENCOUNTER — Other Ambulatory Visit: Payer: Self-pay | Admitting: General Surgery

## 2014-09-24 ENCOUNTER — Other Ambulatory Visit: Payer: Self-pay

## 2014-09-24 DIAGNOSIS — Z853 Personal history of malignant neoplasm of breast: Secondary | ICD-10-CM

## 2014-12-03 ENCOUNTER — Ambulatory Visit (INDEPENDENT_AMBULATORY_CARE_PROVIDER_SITE_OTHER): Payer: Commercial Managed Care - HMO | Admitting: Nurse Practitioner

## 2014-12-03 ENCOUNTER — Other Ambulatory Visit (INDEPENDENT_AMBULATORY_CARE_PROVIDER_SITE_OTHER): Payer: Commercial Managed Care - HMO

## 2014-12-03 ENCOUNTER — Encounter: Payer: Self-pay | Admitting: Nurse Practitioner

## 2014-12-03 VITALS — BP 122/80 | HR 66 | Temp 97.9°F | Resp 14 | Ht 66.0 in | Wt 139.1 lb

## 2014-12-03 DIAGNOSIS — Z0000 Encounter for general adult medical examination without abnormal findings: Secondary | ICD-10-CM | POA: Insufficient documentation

## 2014-12-03 DIAGNOSIS — E039 Hypothyroidism, unspecified: Secondary | ICD-10-CM

## 2014-12-03 DIAGNOSIS — N898 Other specified noninflammatory disorders of vagina: Secondary | ICD-10-CM | POA: Diagnosis not present

## 2014-12-03 LAB — LIPID PANEL
CHOL/HDL RATIO: 3
Cholesterol: 186 mg/dL (ref 0–200)
HDL: 56.8 mg/dL (ref >39.00)
LDL CALC: 104 mg/dL — AB (ref 0–99)
NonHDL: 128.88
TRIGLYCERIDES: 122 mg/dL (ref 0.0–149.0)
VLDL: 24.4 mg/dL (ref 0.0–40.0)

## 2014-12-03 LAB — CBC WITH DIFFERENTIAL/PLATELET
BASOS ABS: 0 10*3/uL (ref 0.0–0.1)
Basophils Relative: 0.8 % (ref 0.0–3.0)
Eosinophils Absolute: 0.1 10*3/uL (ref 0.0–0.7)
Eosinophils Relative: 2.2 % (ref 0.0–5.0)
HCT: 39.8 % (ref 36.0–46.0)
Hemoglobin: 13.4 g/dL (ref 12.0–15.0)
LYMPHS PCT: 33.3 % (ref 12.0–46.0)
Lymphs Abs: 1.4 10*3/uL (ref 0.7–4.0)
MCHC: 33.6 g/dL (ref 30.0–36.0)
MCV: 92.5 fl (ref 78.0–100.0)
MONOS PCT: 8.5 % (ref 3.0–12.0)
Monocytes Absolute: 0.4 10*3/uL (ref 0.1–1.0)
NEUTROS ABS: 2.4 10*3/uL (ref 1.4–7.7)
Neutrophils Relative %: 55.2 % (ref 43.0–77.0)
PLATELETS: 317 10*3/uL (ref 150.0–400.0)
RBC: 4.3 Mil/uL (ref 3.87–5.11)
RDW: 13.1 % (ref 11.5–15.5)
WBC: 4.3 10*3/uL (ref 4.0–10.5)

## 2014-12-03 LAB — COMPREHENSIVE METABOLIC PANEL
ALT: 24 U/L (ref 0–35)
AST: 22 U/L (ref 0–37)
Albumin: 4.6 g/dL (ref 3.5–5.2)
Alkaline Phosphatase: 72 U/L (ref 39–117)
BILIRUBIN TOTAL: 1 mg/dL (ref 0.2–1.2)
BUN: 13 mg/dL (ref 6–23)
CO2: 29 meq/L (ref 19–32)
CREATININE: 0.97 mg/dL (ref 0.40–1.20)
Calcium: 9.8 mg/dL (ref 8.4–10.5)
Chloride: 103 mEq/L (ref 96–112)
GFR: 61.88 mL/min (ref >60.00)
GLUCOSE: 90 mg/dL (ref 70–99)
Potassium: 3.8 mEq/L (ref 3.5–5.1)
SODIUM: 139 meq/L (ref 135–145)
Total Protein: 8 g/dL (ref 6.0–8.3)

## 2014-12-03 LAB — HIV ANTIBODY (ROUTINE TESTING W REFLEX): HIV: NONREACTIVE

## 2014-12-03 LAB — HEMOGLOBIN A1C: Hgb A1c MFr Bld: 5.5 % (ref 4.6–6.5)

## 2014-12-03 LAB — T4, FREE: Free T4: 1.1 ng/dL (ref 0.60–1.60)

## 2014-12-03 LAB — TSH: TSH: 0.44 u[IU]/mL (ref 0.35–4.50)

## 2014-12-03 LAB — VITAMIN D 25 HYDROXY (VIT D DEFICIENCY, FRACTURES): VITD: 41.65 ng/mL (ref 30.00–100.00)

## 2014-12-03 NOTE — Assessment & Plan Note (Signed)
Discussed acute and chronic issues. Reviewed health maintenance measures, PFSHx, and immunizations. Obtain routine labs TSH, Lipid panel, CBC w/ diff, A1c, CMET, and vitamin D. Will screen for HIV and Hep C due to age and once in a lifetime screening recommendations.   Mammogram will be done on Monday. Need to find Bone Density scan.

## 2014-12-03 NOTE — Progress Notes (Signed)
Pre visit review using our clinic review tool, if applicable. No additional management support is needed unless otherwise documented below in the visit note. 

## 2014-12-03 NOTE — Assessment & Plan Note (Signed)
Vaginal discharge x a week or so. She agreed to a wet prep to look for BV and r/out yeast infection although unlikely.

## 2014-12-03 NOTE — Assessment & Plan Note (Signed)
Stable. Last checked in June. Will see if we can add on TSH and T4 free.

## 2014-12-03 NOTE — Progress Notes (Signed)
Patient ID: Ana Craig, female    DOB: 01/20/1951  Age: 64 y.o. MRN: [id number]  CC: Annual Exam   HPI Ana Craig presents for annual exam with clinical breast exam.   1) Health Maintenance-   Diet- No Changes  Exercise- No Changes   Immunizations- flu willing to get today, tdap due in 2020   Zostavax- 2015   Mammogram- Scheduled for 12/07/14  Pap- Hysterectomy   Bone Density- 2 years ago   Colonoscopy- 11/03/11  Eye Exam- 9/16   Dental Exam- UTD  Fall- Neg. Screen  Depression- Neg. Screen  STD screen- Would like Hep C and HIV   3) Acute Problems-  Vaginal discharge- gold colored and malodorous. Denies pruritis or bleeding  History Ana Craig has a past medical history of Osteoporosis; Hypothyroidism; Arthritis; PONV (postoperative nausea and vomiting); Wears glasses; Breast cancer ([place]) (12/25/12); and Basal cell carcinoma (02/06/2014).   She has past surgical history that includes Appendectomy (2002); Abdominal hysterectomy (1998); Tonsillectomy; Colonoscopy; and Breast lumpectomy with needle localization (Right, 12/25/2012).   Her family history includes COPD in her mother.She reports that she has never smoked. She does not have any smokeless tobacco history on file. She reports that she does not drink alcohol or use illicit drugs.  Outpatient Prescriptions Prior to Visit  Medication Sig Dispense Refill  . calcium carbonate (OS-CAL) 600 MG TABS tablet Take 600 mg by mouth 2 (two) times daily with a meal.    . cholecalciferol (VITAMIN D) 1000 UNITS tablet Take 1,000 Units by mouth daily.    Marland Kitchen ibuprofen (ADVIL,MOTRIN) 200 MG tablet Take 600 mg by mouth every 6 (six) hours as needed. Taking for back pain    . levothyroxine (SYNTHROID, LEVOTHROID) 75 MCG tablet 75 mcg daily.    . Multiple Vitamins-Minerals (MULTIVITAMIN WITH MINERALS) tablet Take 1 tablet by mouth daily.    . vitamin C (ASCORBIC ACID) 500 MG tablet Take 500 mg by mouth daily.     No facility-administered medications  prior to visit.    ROS Review of Systems  Constitutional: Negative for fever, chills, diaphoresis, fatigue and unexpected weight change.  HENT: Negative for tinnitus and trouble swallowing.   Eyes: Negative for visual disturbance.  Respiratory: Negative for chest tightness, shortness of breath and wheezing.   Cardiovascular: Negative for chest pain, palpitations and leg swelling.  Gastrointestinal: Negative for nausea, vomiting, abdominal pain, diarrhea, constipation and blood in stool.  Endocrine: Negative for polydipsia, polyphagia and polyuria.  Genitourinary: Positive for vaginal discharge. Negative for dysuria, hematuria, vaginal bleeding and vaginal pain.  Musculoskeletal: Negative for myalgias, back pain, arthralgias and gait problem.  Skin: Negative for color change and rash.  Neurological: Negative for dizziness, weakness, numbness and headaches.  Hematological: Does not bruise/bleed easily.  Psychiatric/Behavioral: Negative for suicidal ideas and sleep disturbance. The patient is not nervous/anxious.     Objective:  BP 122/80 mmHg  Pulse 66  Temp(Src) 97.9 F (36.6 C)  Resp 14  Ht 5\' 6"  (1.676 m)  Wt 139 lb 1.6 oz (63.095 kg)  BMI 22.46 kg/m2  SpO2 96%  Physical Exam  Constitutional: She is oriented to person, place, and time. She appears well-developed and well-nourished. No distress.  HENT:  Head: Normocephalic and atraumatic.  Right Ear: External ear normal.  Left Ear: External ear normal.  Nose: Nose normal.  Mouth/Throat: Oropharynx is clear and moist. No oropharyngeal exudate.  TMs and canals clear bilaterally  Eyes: Conjunctivae and EOM are normal. Pupils are equal, round, and reactive to  light. Right eye exhibits no discharge. Left eye exhibits no discharge. No scleral icterus.  Neck: Normal range of motion. Neck supple. No thyromegaly present.  Cardiovascular: Normal rate, regular rhythm, normal heart sounds and intact distal pulses.  Exam reveals no  gallop and no friction rub.   No murmur heard. Pulmonary/Chest: Effort normal and breath sounds normal. No respiratory distress. She has no wheezes. She has no rales. She exhibits tenderness.  Tenderness of chest at right breast base along ribs.  Clinical breast exam without significant findings. Right breast scar well healed from previous surgery  Abdominal: Soft. Bowel sounds are normal. She exhibits no distension and no mass. There is no tenderness. There is no rebound and no guarding.  Genitourinary: Vaginal discharge found.  Light yellowish-white discharge  Musculoskeletal: Normal range of motion. She exhibits no edema or tenderness.  Lymphadenopathy:    She has no cervical adenopathy.  Neurological: She is alert and oriented to person, place, and time. She has normal reflexes. No cranial nerve deficit. She exhibits normal muscle tone. Coordination normal.  Skin: Skin is warm and dry. No rash noted. She is not diaphoretic. No erythema. No pallor.  Slight scar well healed midline from basal cell carcinoma removal.   Psychiatric: She has a normal mood and affect. Her behavior is normal. Judgment and thought content normal.   Assessment & Plan:   [name] was seen today for annual exam.  Diagnoses and all orders for this visit:  Vaginal discharge -     WET PREP BY MOLECULAR PROBE  Routine general medical examination at a health care facility -     CBC with Differential/Platelet -     Comprehensive metabolic panel -     Hemoglobin A1c -     Lipid panel -     Vit D  25 hydroxy (rtn osteoporosis monitoring) -     HIV antibody (with reflex) -     Hepatitis C antibody  Hypothyroidism, unspecified hypothyroidism type   I am having Ms. Lee maintain her levothyroxine, multivitamin with minerals, vitamin C, calcium carbonate, cholecalciferol, and ibuprofen.  No orders of the defined types were placed in this encounter.     Follow-up: Return in about 1 year (around 12/03/2015) for CPE  w/ fasting labs .

## 2014-12-03 NOTE — Patient Instructions (Signed)

## 2014-12-04 ENCOUNTER — Other Ambulatory Visit: Payer: Self-pay | Admitting: Nurse Practitioner

## 2014-12-04 LAB — WET PREP BY MOLECULAR PROBE
Candida species: NEGATIVE
GARDNERELLA VAGINALIS: POSITIVE — AB
TRICHOMONAS VAG: NEGATIVE

## 2014-12-04 LAB — HEPATITIS C ANTIBODY: HCV AB: NEGATIVE

## 2014-12-04 MED ORDER — METRONIDAZOLE 500 MG PO TABS: 500.0000 mg | ORAL_TABLET | Freq: Two times a day (BID) | ORAL | Status: DC

## 2014-12-07 ENCOUNTER — Other Ambulatory Visit: Payer: BLUE CROSS/BLUE SHIELD

## 2014-12-08 ENCOUNTER — Ambulatory Visit
Admission: RE | Admit: 2014-12-08 | Discharge: 2014-12-08 | Disposition: A | Discharge status: Home / Self Care | Payer: Commercial Managed Care - HMO | Source: Ambulatory Visit | Attending: General Surgery | Admitting: General Surgery

## 2014-12-08 DIAGNOSIS — Z853 Personal history of malignant neoplasm of breast: Secondary | ICD-10-CM

## 2014-12-28 ENCOUNTER — Telehealth: Payer: Self-pay | Admitting: *Deleted

## 2014-12-28 NOTE — Telephone Encounter (Signed)
Left message to schedule appt

## 2014-12-28 NOTE — Telephone Encounter (Signed)
She needs to be seen and swabbed again to make sure the bacterial infection is still present. Thanks!

## 2014-12-28 NOTE — Telephone Encounter (Signed)
You saw the patient on 12/03/14 for this reason? Please advise?

## 2014-12-28 NOTE — Telephone Encounter (Signed)
Please schedule appointment for patient. Thanks.

## 2014-12-28 NOTE — Telephone Encounter (Signed)
Patient stated that she has a vaginal infection, patient stated the infection has not cleared up, and requested another antibiotic.

## 2014-12-29 ENCOUNTER — Ambulatory Visit (INDEPENDENT_AMBULATORY_CARE_PROVIDER_SITE_OTHER): Payer: Commercial Managed Care - HMO | Admitting: Nurse Practitioner

## 2014-12-29 ENCOUNTER — Encounter: Payer: Self-pay | Admitting: Nurse Practitioner

## 2014-12-29 VITALS — BP 122/74 | HR 88 | Temp 97.8°F | Ht 66.0 in | Wt 140.8 lb

## 2014-12-29 DIAGNOSIS — N898 Other specified noninflammatory disorders of vagina: Secondary | ICD-10-CM

## 2014-12-29 NOTE — Progress Notes (Signed)
Pre visit review using our clinic review tool, if applicable. No additional management support is needed unless otherwise documented below in the visit note. 

## 2014-12-29 NOTE — Progress Notes (Signed)
Patient ID: Ana Craig, female    DOB: 1950/11/21  Age: 64 y.o. MRN: [id number]  CC: Vaginal Discharge   HPI [name] presents for CC of vaginal discharge still occuring.   1) Pt was seen on 12/03/14 for vaginal discharge with wet prep revealing BV. She was treated with Flagyl x 7 days and did not feel it took care of the symptoms. She denies pruritis, urinary symptoms, fever, bleeding, or vaginal pain. She does report excessive discharge and moderate odor.   History [place] has a past medical history of Osteoporosis; Hypothyroidism; Arthritis; PONV (postoperative nausea and vomiting); Wears glasses; Breast cancer ([place]) (12/25/12); and Basal cell carcinoma (02/06/2014).   She has past surgical history that includes Appendectomy (2002); Abdominal hysterectomy (1998); Tonsillectomy; Colonoscopy; and Breast lumpectomy with needle localization (Right, 12/25/2012).   Her family history includes COPD in her mother.She reports that she has never smoked. She does not have any smokeless tobacco history on file. She reports that she does not drink alcohol or use illicit drugs.  Outpatient Prescriptions Prior to Visit  Medication Sig Dispense Refill  . calcium carbonate (OS-CAL) 600 MG TABS tablet Take 600 mg by mouth 2 (two) times daily with a meal.    . cholecalciferol (VITAMIN D) 1000 UNITS tablet Take 1,000 Units by mouth daily.    Marland Kitchen ibuprofen (ADVIL,MOTRIN) 200 MG tablet Take 600 mg by mouth every 6 (six) hours as needed. Taking for back pain    . levothyroxine (SYNTHROID, LEVOTHROID) 75 MCG tablet 75 mcg daily.    . Multiple Vitamins-Minerals (MULTIVITAMIN WITH MINERALS) tablet Take 1 tablet by mouth daily.    . vitamin C (ASCORBIC ACID) 500 MG tablet Take 500 mg by mouth daily.    . metroNIDAZOLE (FLAGYL) 500 MG tablet Take 1 tablet (500 mg total) by mouth 2 (two) times daily. 14 tablet 0   No facility-administered medications prior to visit.    ROS Review of Systems  Constitutional: Negative  for fever, chills, diaphoresis and fatigue.  Genitourinary: Positive for vaginal discharge. Negative for dysuria, urgency, frequency, hematuria, flank pain, decreased urine volume, difficulty urinating, vaginal pain and pelvic pain.  Skin: Negative for rash.    Objective:  BP 122/74 mmHg  Pulse 88  Temp(Src) 97.8 F (36.6 C) (Oral)  Ht 5\' 6"  (1.676 m)  Wt 140 lb 12.8 oz (63.866 kg)  BMI 22.74 kg/m2  SpO2 96%  Physical Exam  Constitutional: She is oriented to person, place, and time. She appears well-developed and well-nourished. No distress.  HENT:  Head: Normocephalic and atraumatic.  Right Ear: External ear normal.  Left Ear: External ear normal.  Abdominal: There is no CVA tenderness.  Genitourinary: Vaginal discharge found.  Copious white/gray vaginal discharge  Neurological: She is alert and oriented to person, place, and time.  Skin: Skin is warm and dry. No rash noted. She is not diaphoretic.  Psychiatric: She has a normal mood and affect. Her behavior is normal. Judgment and thought content normal.   Assessment & Plan:   [place] was seen today for vaginal discharge.  Diagnoses and all orders for this visit:  Vaginal discharge -     WET PREP BY MOLECULAR PROBE   I have discontinued Ms. Marguerita Beards metroNIDAZOLE. I am also having her maintain her levothyroxine, multivitamin with minerals, vitamin C, calcium carbonate, cholecalciferol, and ibuprofen.  No orders of the defined types were placed in this encounter.     Follow-up: No Follow-up on file.

## 2014-12-29 NOTE — Patient Instructions (Signed)
Metronidazole 500 mg twice daily orally for seven days .Metronidazole gel 0.75 percent (5 grams containing 37.5 mg metronidazole) once daily vaginally for 5 days .Clindamycin 2% vaginal cream once daily at bedtime for seven days .Clindamycin 300 mg twice per day orally for seven days  These are the treatment options. I would recommend switching it up if the result comes back positive again.   We will send a MyChart with results tomorrow.

## 2014-12-30 ENCOUNTER — Other Ambulatory Visit: Payer: Self-pay | Admitting: Nurse Practitioner

## 2014-12-30 ENCOUNTER — Telehealth: Payer: Self-pay | Admitting: *Deleted

## 2014-12-30 LAB — WET PREP BY MOLECULAR PROBE
Candida species: NEGATIVE
Gardnerella vaginalis: POSITIVE — AB
Trichomonas vaginosis: NEGATIVE

## 2014-12-30 MED ORDER — CLINDAMYCIN HCL 300 MG PO CAPS: 300.0000 mg | ORAL_CAPSULE | Freq: Two times a day (BID) | ORAL | Status: DC

## 2014-12-30 NOTE — Telephone Encounter (Signed)
Clindamycin has been called into her pharmacy. Thanks!

## 2014-12-30 NOTE — Telephone Encounter (Signed)
Patient requested a Rx for vaginal infection, she stated that the medication was offered at her office visit. Patient was unsure of the name of th medication

## 2014-12-30 NOTE — Telephone Encounter (Signed)
PT AWARE OF MEDS BEING CALLED IN

## 2015-01-03 NOTE — Assessment & Plan Note (Signed)
Recurrence of BV. Pt will be treated with Clindamycin twice daily PO x 7 days. Pt was agreeable. FU prn worsening/failure to improve.

## 2015-01-14 ENCOUNTER — Encounter: Payer: Self-pay | Admitting: Nurse Practitioner

## 2015-01-14 ENCOUNTER — Ambulatory Visit (INDEPENDENT_AMBULATORY_CARE_PROVIDER_SITE_OTHER): Payer: Commercial Managed Care - HMO | Admitting: Nurse Practitioner

## 2015-01-14 VITALS — BP 114/72 | HR 79 | Temp 97.9°F | Resp 14 | Ht 66.0 in | Wt 141.2 lb

## 2015-01-14 DIAGNOSIS — L589 Radiodermatitis, unspecified: Secondary | ICD-10-CM

## 2015-01-14 DIAGNOSIS — N898 Other specified noninflammatory disorders of vagina: Secondary | ICD-10-CM | POA: Diagnosis not present

## 2015-01-14 NOTE — Progress Notes (Signed)
Pre visit review using our clinic review tool, if applicable. No additional management support is needed unless otherwise documented below in the visit note. 

## 2015-01-14 NOTE — Assessment & Plan Note (Signed)
Uncontrolled. Flagyl and Clindamycin rounds were not helpful for the discharge. She was positive for BV twice on wet preps recently. Will not repeat today and defer to OB.GYN. Will follow

## 2015-01-14 NOTE — Patient Instructions (Signed)
OB/Gyn follow up of recurring bacterial vaginosis.

## 2015-01-14 NOTE — Assessment & Plan Note (Signed)
Following up with Dermatology (Dr. Marolyn Hammock) for radiation dermatitis and basal cell.

## 2015-01-14 NOTE — Progress Notes (Signed)
Patient ID: Ana Craig, female    DOB: 08-Oct-1950  Age: 64 y.o. MRN: [id number]  CC: Follow-up   HPI Ana Craig presents for follow up of vaginal discharge.   1) Not improving, bright gold discharge   Activia daily and probiotics daily  No recent sexual activity Hysterectomy- only uterus   Dermatologist today   History Ana Craig has a past medical history of Osteoporosis; Hypothyroidism; Arthritis; PONV (postoperative nausea and vomiting); Wears glasses; Breast cancer ([place]) (12/25/12); and Basal cell carcinoma (02/06/2014).   She has past surgical history that includes Appendectomy (2002); Abdominal hysterectomy (1998); Tonsillectomy; Colonoscopy; and Breast lumpectomy with needle localization (Right, 12/25/2012).   Her family history includes COPD in her mother.She reports that she has never smoked. She does not have any smokeless tobacco history on file. She reports that she does not drink alcohol or use illicit drugs.  Outpatient Prescriptions Prior to Visit  Medication Sig Dispense Refill  . calcium carbonate (OS-CAL) 600 MG TABS tablet Take 600 mg by mouth 2 (two) times daily with a meal.    . cholecalciferol (VITAMIN D) 1000 UNITS tablet Take 1,000 Units by mouth daily.    . clindamycin (CLEOCIN) 300 MG capsule Take 1 capsule (300 mg total) by mouth 2 (two) times daily. 14 capsule 0  . ibuprofen (ADVIL,MOTRIN) 200 MG tablet Take 600 mg by mouth every 6 (six) hours as needed. Taking for back pain    . levothyroxine (SYNTHROID, LEVOTHROID) 75 MCG tablet 75 mcg daily.    . Multiple Vitamins-Minerals (MULTIVITAMIN WITH MINERALS) tablet Take 1 tablet by mouth daily.    . vitamin C (ASCORBIC ACID) 500 MG tablet Take 500 mg by mouth daily.     No facility-administered medications prior to visit.    ROS Review of Systems  Constitutional: Negative for fever and fatigue.  Respiratory: Negative for shortness of breath.   Gastrointestinal: Negative for nausea, vomiting, abdominal pain,  diarrhea and abdominal distention.  Genitourinary: Positive for vaginal discharge. Negative for vaginal bleeding and vaginal pain.       Gold vaginal discharge and slight fishy smell  Skin: Negative for rash.    Objective:  BP 114/72 mmHg  Pulse 79  Temp(Src) 97.9 F (36.6 C)  Resp 14  Ht 5\' 6"  (1.676 m)  Wt 141 lb 3.2 oz (64.048 kg)  BMI 22.80 kg/m2  SpO2 98%  Physical Exam  Constitutional: She is oriented to person, place, and time. She appears well-developed and well-nourished. No distress.  Genitourinary:  Deferred to OB/GYN  Neurological: She is alert and oriented to person, place, and time.  Skin: She is not diaphoretic.  Psychiatric: She has a normal mood and affect. Her behavior is normal. Judgment and thought content normal.   Assessment & Plan:   There are no diagnoses linked to this encounter. I am having Ana Craig maintain her levothyroxine, multivitamin with minerals, vitamin C, calcium carbonate, cholecalciferol, ibuprofen, and clindamycin.  No orders of the defined types were placed in this encounter.     Follow-up: No Follow-up on file.

## 2015-05-31 ENCOUNTER — Other Ambulatory Visit: Payer: Self-pay | Admitting: Nurse Practitioner

## 2015-05-31 NOTE — Telephone Encounter (Signed)
Okay to refill? This is listed as historical on her med list. Last TSH was checked in October.

## 2015-05-31 NOTE — Telephone Encounter (Signed)
Pt called about needing a refill for levothyroxine (SYNTHROID, LEVOTHROID) 75 MCG tablet. Pharmacy is [facility] [place], [place]. Call pt @ [phone]. Thank you!

## 2015-06-01 NOTE — Telephone Encounter (Signed)
Pt called in to follow up on Rx pt states she has three pills left. Call pt @ [phone]. Thank you!

## 2015-06-01 NOTE — Telephone Encounter (Signed)
Pt notified that we are waiting on a response from [facility] since she is not the original prescriber.

## 2015-06-02 ENCOUNTER — Telehealth: Payer: Self-pay | Admitting: Nurse Practitioner

## 2015-06-02 MED ORDER — LEVOTHYROXINE SODIUM 75 MCG PO TABS: 75.0000 ug | ORAL_TABLET | Freq: Every day | ORAL | Status: DC

## 2015-06-02 NOTE — Telephone Encounter (Signed)
Sent in

## 2015-06-02 NOTE — Telephone Encounter (Signed)
Pt called in about needing a refill for levothyroxine (SYNTHROID, LEVOTHROID) 75 MCG tablet. Pharmacy is [facility] [place], [place]. Call pt @ [phone]. Thank you!

## 2015-06-02 NOTE — Telephone Encounter (Signed)
Ok. I called pt. Thank you! 

## 2015-11-09 ENCOUNTER — Other Ambulatory Visit: Payer: Self-pay | Admitting: General Surgery

## 2015-11-09 DIAGNOSIS — Z853 Personal history of malignant neoplasm of breast: Secondary | ICD-10-CM

## 2015-11-29 ENCOUNTER — Telehealth: Payer: Self-pay | Admitting: Family Medicine

## 2015-11-29 NOTE — Telephone Encounter (Signed)
Pt called and is requesting a refill on levothyroxine (SYNTHROID, LEVOTHROID) 75 MCG tablet. She has an appointment with Dr. Caryl Bis on 10/30. She is a former [employer] pt.    [place], [place] - [place]  Call pt @ [phone]

## 2015-11-29 NOTE — Telephone Encounter (Signed)
Can we refill this. Patient has appointment 12/06/15

## 2015-12-01 MED ORDER — LEVOTHYROXINE SODIUM 75 MCG PO TABS: 75.0000 ug | ORAL_TABLET | Freq: Every day | ORAL | 1 refills | Status: DC

## 2015-12-01 NOTE — Telephone Encounter (Signed)
Refill sent to pharmacy. She should keep her appointment.

## 2015-12-01 NOTE — Telephone Encounter (Signed)
Please advise on refill.

## 2015-12-01 NOTE — Telephone Encounter (Signed)
Pt has requested a update on this Rx. Per pt -She only has one pill left.  Pharmacy on [address]

## 2015-12-06 ENCOUNTER — Ambulatory Visit (INDEPENDENT_AMBULATORY_CARE_PROVIDER_SITE_OTHER): Payer: Commercial Managed Care - HMO

## 2015-12-06 ENCOUNTER — Encounter: Payer: Self-pay | Admitting: Family Medicine

## 2015-12-06 ENCOUNTER — Ambulatory Visit (INDEPENDENT_AMBULATORY_CARE_PROVIDER_SITE_OTHER): Payer: Commercial Managed Care - HMO | Admitting: Family Medicine

## 2015-12-06 VITALS — BP 132/80 | HR 66 | Temp 97.7°F | Ht 65.5 in | Wt 140.0 lb

## 2015-12-06 DIAGNOSIS — Z23 Encounter for immunization: Secondary | ICD-10-CM | POA: Diagnosis not present

## 2015-12-06 DIAGNOSIS — Z0001 Encounter for general adult medical examination with abnormal findings: Secondary | ICD-10-CM

## 2015-12-06 DIAGNOSIS — G44229 Chronic tension-type headache, not intractable: Secondary | ICD-10-CM | POA: Diagnosis not present

## 2015-12-06 DIAGNOSIS — R519 Headache, unspecified: Secondary | ICD-10-CM | POA: Insufficient documentation

## 2015-12-06 DIAGNOSIS — R51 Headache: Secondary | ICD-10-CM

## 2015-12-06 DIAGNOSIS — Z130 Encounter for screening for diseases of the blood and blood-forming organs and certain disorders involving the immune mechanism: Secondary | ICD-10-CM

## 2015-12-06 DIAGNOSIS — R0781 Pleurodynia: Secondary | ICD-10-CM | POA: Diagnosis not present

## 2015-12-06 DIAGNOSIS — Z1211 Encounter for screening for malignant neoplasm of colon: Secondary | ICD-10-CM | POA: Diagnosis not present

## 2015-12-06 DIAGNOSIS — E039 Hypothyroidism, unspecified: Secondary | ICD-10-CM

## 2015-12-06 LAB — LIPID PANEL
Cholesterol: 208 mg/dL — ABNORMAL HIGH (ref 0–200)
HDL: 67.5 mg/dL (ref >39.00)
LDL CALC: 125 mg/dL — AB (ref 0–99)
NONHDL: 140.46
Total CHOL/HDL Ratio: 3
Triglycerides: 75 mg/dL (ref 0.0–149.0)
VLDL: 15 mg/dL (ref 0.0–40.0)

## 2015-12-06 LAB — COMPREHENSIVE METABOLIC PANEL
ALBUMIN: 4.2 g/dL (ref 3.5–5.2)
ALT: 37 U/L — ABNORMAL HIGH (ref 0–35)
AST: 29 U/L (ref 0–37)
Alkaline Phosphatase: 93 U/L (ref 39–117)
BUN: 16 mg/dL (ref 6–23)
CHLORIDE: 104 meq/L (ref 96–112)
CO2: 29 meq/L (ref 19–32)
CREATININE: 0.98 mg/dL (ref 0.40–1.20)
Calcium: 9.5 mg/dL (ref 8.4–10.5)
GFR: 60.95 mL/min (ref >60.00)
Glucose, Bld: 85 mg/dL (ref 70–99)
POTASSIUM: 3.9 meq/L (ref 3.5–5.1)
SODIUM: 141 meq/L (ref 135–145)
Total Bilirubin: 1.5 mg/dL — ABNORMAL HIGH (ref 0.2–1.2)
Total Protein: 7.1 g/dL (ref 6.0–8.3)

## 2015-12-06 LAB — CBC
HEMATOCRIT: 38 % (ref 36.0–46.0)
Hemoglobin: 12.9 g/dL (ref 12.0–15.0)
MCHC: 34 g/dL (ref 30.0–36.0)
MCV: 90.6 fl (ref 78.0–100.0)
Platelets: 294 10*3/uL (ref 150.0–400.0)
RBC: 4.2 Mil/uL (ref 3.87–5.11)
RDW: 12.9 % (ref 11.5–15.5)
WBC: 4 10*3/uL (ref 4.0–10.5)

## 2015-12-06 LAB — HEMOGLOBIN A1C: HEMOGLOBIN A1C: 5.5 % (ref 4.6–6.5)

## 2015-12-06 LAB — TSH: TSH: 0.79 u[IU]/mL (ref 0.35–4.50)

## 2015-12-06 NOTE — Progress Notes (Signed)
Pre visit review using our clinic review tool, if applicable. No additional management support is needed unless otherwise documented below in the visit note. 

## 2015-12-06 NOTE — Assessment & Plan Note (Signed)
Patient with history of chronic intermittent headaches. Had a bitemporal tension-like headache yesterday. Also had some associated diarrhea. Could be related to viral illness. Could be consistent with her prior headache history. Neurologically intact. Advised to monitor for recurrence and if she develops changing headaches or more frequent headaches she needs to let us know immediately. Given return precautions.

## 2015-12-06 NOTE — Patient Instructions (Signed)
Nice to see you. We're going to get an x-ray and call you with the results. We'll also get some lab work call with the results. Please monitor for recurrence of your headaches. If they become more frequent please let us know. If you develop numbness, weakness, change in vision, or any new or changing symptoms please seek medical attention immediately.

## 2015-12-06 NOTE — Progress Notes (Signed)
Tommi Rumps, MD Phone: [phone]  Ana Craig is a 65 y.o. female who presents today for physical exam.  Diet consists of eating some frozen foods and canned vegetables from her garden. Does not cook very much. Does eat out some. Not really exercising. She is active doing chores at home. Status post hysterectomy for noncancerous reason. No recent Pap smears. History of breast cancer and has a mammogram scheduled for later this week. Does note some intermittent discomfort in her right rib just inferior to her right breast. Has been present since radiation therapy. Notes it is sharp and catches depending on how she turns. She is due for colonoscopy. Hepatitis C and HIV testing done previously.  Tetanus vaccination and Zostavax up-to-date. Flu shot today. No tobacco use, alcohol use, or illicit drug use. She is followed by ophthalmology and dentistry. No family history of cancer.  Patient notes she had a headache yesterday. Mild bitemporal headache. Also some associated diarrhea. No numbness or weakness. No vision changes. No abdominal pain. No fevers. No vomiting. Has a history of headaches in the past. She has none of these symptoms today.  Active Ambulatory Problems    Diagnosis Date Noted  . 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 Hypothyroidism 07/17/2014  . Elevated glucose 07/17/2014  . 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 Vaginal discharge 12/03/2014  . Encounter for general adult medical examination with abnormal findings 12/06/2015  . Headache 12/06/2015  . Rib pain on right side 12/06/2015   Resolved Ambulatory Problems    Diagnosis Date Noted  . Encounter to establish care 07/26/2014  . Routine general medical examination at a health care facility 12/03/2014   Past Medical History:  Diagnosis Date  . Arthritis   . Basal cell carcinoma 02/06/2014  . Breast cancer ([place]) 12/25/12  . Hypothyroidism   . Osteoporosis   . PONV (postoperative nausea and  vomiting)   . Wears glasses     Family History  Problem Relation Age of Onset  . COPD Mother     Social History   Social History  . Marital status: Married    Spouse name: N/A  . Number of children: N/A  . Years of education: N/A   Occupational History  . Not on file.   Social History Main Topics  . Smoking status: Never Smoker  . Smokeless tobacco: Not on file  . Alcohol use No  . Drug use: No  . Sexual activity: Not Currently    Partners: Male   Other Topics Concern  . Not on file   Social History Narrative  . No narrative on file    ROS  General:  Negative for nexplained weight loss, fever Skin: Negative for new or changing mole, sore that won't heal HEENT: Negative for trouble hearing, trouble seeing, ringing in ears, mouth sores, hoarseness, change in voice, dysphagia. CV:  Negative for chest pain, dyspnea, edema, palpitations Resp: Negative for cough, dyspnea, hemoptysis GI: Negative for nausea, vomiting, diarrhea, constipation, abdominal pain, melena, hematochezia. GU: Negative for dysuria, incontinence, urinary hesitance, hematuria, vaginal or penile discharge, polyuria, sexual difficulty, lumps in testicle or breasts MSK: Positive for rib pain, Negative for muscle cramps or aches, joint pain or swelling Neuro: Positive for headaches, negative for weakness, numbness, dizziness, passing out/fainting Psych: Negative for depression, anxiety, memory problems  Objective  Physical Exam Vitals:   12/06/15 0907  BP: 132/80  Pulse: 66  Temp: 97.7 F (36.5 C)    BP Readings from Last 3  Encounters:  12/06/15 132/80  01/14/15 114/72  12/29/14 122/74   Wt Readings from Last 3 Encounters:  12/06/15 140 lb (63.5 kg)  01/14/15 141 lb 3.2 oz (64 kg)  12/29/14 140 lb 12.8 oz (63.9 kg)    Physical Exam  Constitutional: She is well-developed, well-nourished, and in no distress.  HENT:  Head: Normocephalic and atraumatic.  Mouth/Throat: Oropharynx is clear  and moist. No oropharyngeal exudate.  Eyes: Conjunctivae are normal. Pupils are equal, round, and reactive to light.  Neck: Neck supple.  Cardiovascular: Normal rate, regular rhythm and normal heart sounds.   Pulmonary/Chest: Effort normal and breath sounds normal.    Abdominal: Soft. Bowel sounds are normal. She exhibits no distension. There is no tenderness. There is no rebound and no guarding.  Musculoskeletal: She exhibits no edema.  Lymphadenopathy:    She has no cervical adenopathy.  Neurological: She is alert. Gait normal.  Skin: Skin is warm and dry.  Psychiatric: Mood and affect normal.     Assessment/Plan:   Encounter for general adult medical examination with abnormal findings Overall doing well. Advised on diet and exercise. Pap smear and pelvic deferred given history of hysterectomy. GI referral for colonoscopy placed. She has a mammogram scheduled later this week. She'll keep this. Will obtain x-ray of her right ribs and chest to evaluate for cause of her rib pain. Suspect musculoskeletal cause or related to prior radiation since it's been present since she had radiation therapy. Lab work as outlined below. Flu shot given.  Headache Patient with history of chronic intermittent headaches. Had a bitemporal tension-like headache yesterday. Also had some associated diarrhea. Could be related to viral illness. Could be consistent with her prior headache history. Neurologically intact. Advised to monitor for recurrence and if she develops changing headaches or more frequent headaches she needs to let us know immediately. Given return precautions.  Rib pain on right side Chronic intermittent right-sided rib pain since having radiation therapy several years ago for breast cancer. Notes it will catch at times. Mildly tender on exam today. Suspect musculoskeletal cause. We will obtain x-ray right ribs and chest to evaluate further. She will have her mammogram as scheduled. She'll  continue to monitor.   Orders Placed This Encounter  Procedures  . DG Ribs Unilateral W/Chest Right    Standing Status:   Future    Number of Occurrences:   1    Standing Expiration Date:   02/04/2017    Order Specific Question:   Reason for Exam (SYMPTOM  OR DIAGNOSIS REQUIRED)    Answer:   right rib pain for several years, history of breast cancer and radiation therapy    Order Specific Question:   Preferred imaging location?    Answer:   [facility]  . Flu Vaccine QUAD 36+ mos IM  . TSH  . CBC  . Comp Met (CMET)  . HgB A1c  . Lipid Profile  . Ambulatory referral to Gastroenterology    Referral Priority:   Routine    Referral Type:   Consultation    Referral Reason:   Specialty Services Required    Number of Visits Requested:   1    No orders of the defined types were placed in this encounter.    Tommi Rumps, MD [place]

## 2015-12-06 NOTE — Assessment & Plan Note (Signed)
Chronic intermittent right-sided rib pain since having radiation therapy several years ago for breast cancer. Notes it will catch at times. Mildly tender on exam today. Suspect musculoskeletal cause. We will obtain x-ray right ribs and chest to evaluate further. She will have her mammogram as scheduled. She'll continue to monitor.

## 2015-12-06 NOTE — Assessment & Plan Note (Signed)
Overall doing well. Advised on diet and exercise. Pap smear and pelvic deferred given history of hysterectomy. GI referral for colonoscopy placed. She has a mammogram scheduled later this week. She'll keep this. Will obtain x-ray of her right ribs and chest to evaluate for cause of her rib pain. Suspect musculoskeletal cause or related to prior radiation since it's been present since she had radiation therapy. Lab work as outlined below. Flu shot given.

## 2015-12-09 ENCOUNTER — Ambulatory Visit
Admission: RE | Admit: 2015-12-09 | Discharge: 2015-12-09 | Disposition: A | Discharge status: Home / Self Care | Payer: Commercial Managed Care - HMO | Source: Ambulatory Visit | Attending: General Surgery | Admitting: General Surgery

## 2015-12-09 ENCOUNTER — Encounter: Payer: Self-pay | Admitting: Gastroenterology

## 2015-12-09 DIAGNOSIS — Z853 Personal history of malignant neoplasm of breast: Secondary | ICD-10-CM

## 2015-12-16 ENCOUNTER — Other Ambulatory Visit: Payer: Self-pay | Admitting: Family Medicine

## 2015-12-16 DIAGNOSIS — R945 Abnormal results of liver function studies: Principal | ICD-10-CM

## 2015-12-16 DIAGNOSIS — R7989 Other specified abnormal findings of blood chemistry: Secondary | ICD-10-CM

## 2015-12-21 ENCOUNTER — Other Ambulatory Visit (INDEPENDENT_AMBULATORY_CARE_PROVIDER_SITE_OTHER): Payer: Commercial Managed Care - HMO

## 2015-12-21 DIAGNOSIS — R945 Abnormal results of liver function studies: Principal | ICD-10-CM

## 2015-12-21 DIAGNOSIS — R7989 Other specified abnormal findings of blood chemistry: Secondary | ICD-10-CM

## 2015-12-21 LAB — COMPREHENSIVE METABOLIC PANEL
ALBUMIN: 4.3 g/dL (ref 3.5–5.2)
ALK PHOS: 86 U/L (ref 39–117)
ALT: 16 U/L (ref 0–35)
AST: 17 U/L (ref 0–37)
BILIRUBIN TOTAL: 0.5 mg/dL (ref 0.2–1.2)
BUN: 14 mg/dL (ref 6–23)
CO2: 32 mEq/L (ref 19–32)
Calcium: 9.5 mg/dL (ref 8.4–10.5)
Chloride: 102 mEq/L (ref 96–112)
Creatinine, Ser: 1.05 mg/dL (ref 0.40–1.20)
GFR: 56.28 mL/min — AB (ref >60.00)
GLUCOSE: 69 mg/dL — AB (ref 70–99)
Potassium: 4.1 mEq/L (ref 3.5–5.1)
Sodium: 140 mEq/L (ref 135–145)
TOTAL PROTEIN: 7.3 g/dL (ref 6.0–8.3)

## 2015-12-22 ENCOUNTER — Encounter: Payer: Self-pay | Admitting: Family Medicine

## 2015-12-22 ENCOUNTER — Other Ambulatory Visit: Payer: Self-pay | Admitting: Family Medicine

## 2015-12-22 ENCOUNTER — Ambulatory Visit: Payer: Commercial Managed Care - HMO | Admitting: Gastroenterology

## 2015-12-22 DIAGNOSIS — Z1211 Encounter for screening for malignant neoplasm of colon: Secondary | ICD-10-CM

## 2015-12-23 ENCOUNTER — Other Ambulatory Visit: Payer: Self-pay | Admitting: Gastroenterology

## 2015-12-27 ENCOUNTER — Other Ambulatory Visit: Payer: Self-pay | Admitting: Gastroenterology

## 2016-01-29 ENCOUNTER — Other Ambulatory Visit: Payer: Self-pay | Admitting: Family Medicine

## 2016-02-02 ENCOUNTER — Encounter: Payer: Self-pay | Admitting: Family Medicine

## 2016-02-28 ENCOUNTER — Encounter (HOSPITAL_COMMUNITY): Payer: Self-pay

## 2016-02-29 ENCOUNTER — Encounter (HOSPITAL_COMMUNITY)
Admission: RE | Disposition: A | Discharge status: Home / Self Care | Payer: Self-pay | Source: Ambulatory Visit | Attending: Gastroenterology

## 2016-02-29 ENCOUNTER — Ambulatory Visit (HOSPITAL_COMMUNITY)
Admission: RE | Admit: 2016-02-29 | Discharge: 2016-02-29 | Disposition: A | Discharge status: Home / Self Care | Payer: Commercial Managed Care - HMO | Source: Ambulatory Visit | Attending: Gastroenterology | Admitting: Gastroenterology

## 2016-02-29 ENCOUNTER — Ambulatory Visit (HOSPITAL_COMMUNITY): Payer: Commercial Managed Care - HMO | Admitting: Anesthesiology

## 2016-02-29 ENCOUNTER — Encounter (HOSPITAL_COMMUNITY): Payer: Self-pay | Admitting: *Deleted

## 2016-02-29 DIAGNOSIS — E039 Hypothyroidism, unspecified: Secondary | ICD-10-CM | POA: Diagnosis not present

## 2016-02-29 DIAGNOSIS — Z1211 Encounter for screening for malignant neoplasm of colon: Secondary | ICD-10-CM | POA: Insufficient documentation

## 2016-02-29 DIAGNOSIS — K219 Gastro-esophageal reflux disease without esophagitis: Secondary | ICD-10-CM | POA: Insufficient documentation

## 2016-02-29 HISTORY — PX: COLONOSCOPY WITH PROPOFOL: SHX5780

## 2016-02-29 LAB — HM COLONOSCOPY

## 2016-02-29 SURGERY — COLONOSCOPY WITH PROPOFOL
Anesthesia: Monitor Anesthesia Care

## 2016-02-29 MED ORDER — PROPOFOL 500 MG/50ML IV EMUL
INTRAVENOUS | Status: DC | PRN
  Administered 2016-02-29: 10 mg via INTRAVENOUS
  Administered 2016-02-29: 40 mg via INTRAVENOUS
  Administered 2016-02-29: 10 mg via INTRAVENOUS

## 2016-02-29 MED ORDER — LACTATED RINGERS IV SOLN
INTRAVENOUS | Status: DC
  Administered 2016-02-29: 1000 mL via INTRAVENOUS

## 2016-02-29 MED ORDER — PROPOFOL 500 MG/50ML IV EMUL
INTRAVENOUS | Status: DC | PRN
  Administered 2016-02-29: 100 ug/kg/min via INTRAVENOUS

## 2016-02-29 MED ORDER — SODIUM CHLORIDE 0.9 % IV SOLN: INTRAVENOUS | Status: DC

## 2016-02-29 MED ORDER — PROPOFOL 10 MG/ML IV BOLUS
INTRAVENOUS | Status: AC
  Filled 2016-02-29: qty 40

## 2016-02-29 SURGICAL SUPPLY — 22 items

## 2016-02-29 NOTE — Anesthesia Preprocedure Evaluation (Addendum)
Anesthesia Evaluation  Patient identified by MRN, date of birth, ID band Patient awake    Reviewed: Allergy & Precautions, NPO status , Patient's Chart, lab work & pertinent test results  History of Anesthesia Complications (+) PONV and history of anesthetic complications  Airway Mallampati: IV  TM Distance: <3 FB Neck ROM: Full    Dental  (+) Teeth Intact   Pulmonary neg pulmonary ROS,    breath sounds clear to auscultation       Cardiovascular negative cardio ROS   Rhythm:Regular     Neuro/Psych  Headaches,    GI/Hepatic negative GI ROS, Neg liver ROS,   Endo/Other  Hypothyroidism   Renal/GU negative Renal ROS     Musculoskeletal  (+) Arthritis ,   Abdominal   Peds  Hematology   Anesthesia Other Findings   Reproductive/Obstetrics                             Anesthesia Physical Anesthesia Plan  ASA: II  Anesthesia Plan: MAC   Post-op Pain Management:    Induction: Intravenous  Airway Management Planned: Natural Airway, Nasal Cannula and Simple Face Mask  Additional Equipment: None  Intra-op Plan:   Post-operative Plan:   Informed Consent: I have reviewed the patients History and Physical, chart, labs and discussed the procedure including the risks, benefits and alternatives for the proposed anesthesia with the patient or authorized representative who has indicated his/her understanding and acceptance.   Dental advisory given  Plan Discussed with: Surgeon  Anesthesia Plan Comments:         Anesthesia Quick Evaluation

## 2016-02-29 NOTE — H&P (Signed)
Procedure: Screening colonoscopy. 12/25/2005 normal screening colonoscopy was performed  History: The patient is a 66 year old female born [birth year]. She is scheduled to undergo a repeat screening colonoscopy today.  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 Sinus surgery. Appendectomy. Tonsillectomy. Total abdominal hysterectomy to treat uterine fibroids. Hypothyroidism. Allergic rhinitis. Gastroesophageal reflux. Osteoporosis.  Medication allergies: Codeine and penicillin  Exam: The patient is alert and lying comfortably on the endoscopy stretcher. Abdomen is soft and nontender to palpation. Lungs are clear to auscultation. Cardiac exam reveals a regular rhythm.  Plan: Proceed with screening colonoscopy

## 2016-02-29 NOTE — Transfer of Care (Signed)
Immediate Anesthesia Transfer of Care Note  Patient: Ana Craig  Procedure(s) Performed: Procedure(s): COLONOSCOPY WITH PROPOFOL (N/A)  Patient Location: PACU  Anesthesia Type:MAC  Level of Consciousness: awake, alert  and oriented  Airway & Oxygen Therapy: Patient Spontanous Breathing and Patient connected to face mask oxygen  Post-op Assessment: Report given to RN and Post -op Vital signs reviewed and stable  Post vital signs: Reviewed and stable  Last Vitals:  Vitals:   02/29/16 1033  BP: (!) 145/77  Pulse: 83  Resp: 10  Temp: 36.6 C    Last Pain:  Vitals:   02/29/16 1033  TempSrc: Oral         Complications: No apparent anesthesia complications

## 2016-02-29 NOTE — Op Note (Signed)
[facility] Patient Name: Ana Craig Procedure Date: 02/29/2016 MRN: [id number] Attending MD: Garlan Fair , MD Date of Birth: 02/01/1951 CSN: [id number] Age: 66 Admit Type: Outpatient Procedure:                Colonoscopy Indications:              Screening for colorectal malignant neoplasm Providers:                Garlan Fair, MD, Laverta Baltimore RN, RN,                            [facility], Technician, Rosario Adie,                            CRNA Referring MD:              Medicines:                Propofol per Anesthesia Complications:            No immediate complications. Estimated Blood Loss:     Estimated blood loss: none. Procedure:                Pre-Anesthesia Assessment:                           - Prior to the procedure, a History and Physical                            was performed, and patient medications and                            allergies were reviewed. The patient's tolerance of                            previous anesthesia was also reviewed. The risks                            and benefits of the procedure and the sedation                            options and risks were discussed with the patient.                            All questions were answered, and informed consent                            was obtained. Prior Anticoagulants: The patient has                            taken no previous anticoagulant or antiplatelet                            agents. ASA Grade Assessment: II - A patient with  mild systemic disease. After reviewing the risks                            and benefits, the patient was deemed in                            satisfactory condition to undergo the procedure.                           After obtaining informed consent, the colonoscope                            was passed under direct vision. Throughout the                            procedure, the patient's  blood pressure, pulse, and                            oxygen saturations were monitored continuously. The                            [id number] scope was introduced through                            the anus and advanced to the the cecum, identified                            by appendiceal orifice and ileocecal valve. The                            colonoscopy was performed without difficulty. The                            patient tolerated the procedure well. The quality                            of the bowel preparation was good. The appendiceal                            orifice and the rectum were photographed. Findings:      The perianal and digital rectal examinations were normal.      The entire examined colon appeared normal. Impression:               - The entire examined colon is normal.                           - No specimens collected. Moderate Sedation:      N/A- Per Anesthesia Care Recommendation:           - Patient has a contact number available for                            emergencies. The signs and symptoms of potential  delayed complications were discussed with the                            patient. Return to normal activities tomorrow.                            Written discharge instructions were provided to the                            patient.                           - Repeat colonoscopy in 10 years for screening                            purposes.                           - Resume previous diet.                           - Continue present medications. Procedure Code(s):        --- Professional ---                           [id number], Colorectal cancer screening; colonoscopy on                            individual not meeting criteria for high risk Diagnosis Code(s):        --- Professional ---                           Z12.11, Encounter for screening for malignant                            neoplasm of colon CPT  copyright 2016 American Medical Association. All rights reserved. The codes documented in this report are preliminary and upon coder review may  be revised to meet current compliance requirements. Earle Gell, MD Garlan Fair, MD 02/29/2016 12:00:16 PM This report has been signed electronically. Number of Addenda: 0

## 2016-02-29 NOTE — Anesthesia Postprocedure Evaluation (Addendum)
Anesthesia Post Note  Patient: Ana Craig  Procedure(s) Performed: Procedure(s) (LRB): COLONOSCOPY WITH PROPOFOL (N/A)  Patient location during evaluation: Endoscopy Anesthesia Type: MAC Level of consciousness: awake and alert Pain management: pain level controlled Vital Signs Assessment: post-procedure vital signs reviewed and stable Respiratory status: spontaneous breathing, nonlabored ventilation, respiratory function stable and patient connected to nasal cannula oxygen Cardiovascular status: stable and blood pressure returned to baseline Anesthetic complications: no       Last Vitals:  Vitals:   02/29/16 1225 02/29/16 1230  BP:  116/62  Pulse: 73 70  Resp: 19 18  Temp:      Last Pain:  Vitals:   02/29/16 1033  TempSrc: Oral                 Darvin Dials

## 2016-02-29 NOTE — Discharge Instructions (Signed)

## 2016-03-01 ENCOUNTER — Encounter (HOSPITAL_COMMUNITY): Payer: Self-pay | Admitting: Gastroenterology

## 2016-03-28 ENCOUNTER — Other Ambulatory Visit: Payer: Self-pay | Admitting: Family Medicine

## 2016-04-06 HISTORY — PX: OPEN REDUCTION INTERNAL FIXATION (ORIF) DISTAL RADIAL FRACTURE: SHX5989

## 2016-05-02 DIAGNOSIS — S5291XA Unspecified fracture of right forearm, initial encounter for closed fracture: Secondary | ICD-10-CM | POA: Insufficient documentation

## 2016-06-30 ENCOUNTER — Telehealth: Payer: Self-pay | Admitting: *Deleted

## 2016-06-30 MED ORDER — SYNTHROID 75 MCG PO TABS: 75.0000 ug | ORAL_TABLET | Freq: Every day | ORAL | 0 refills | Status: DC

## 2016-06-30 NOTE — Telephone Encounter (Signed)
Patient requested a medication refill for synthroid  Pt contact [facility] on [address]  Pt is scheduled to see provider in June

## 2016-07-10 NOTE — Addendum Note (Signed)
Addendum  created 07/10/16 1020 by Oleta Mouse, MD   Sign clinical note

## 2016-07-28 ENCOUNTER — Ambulatory Visit: Payer: Commercial Managed Care - HMO | Admitting: Family Medicine

## 2016-08-02 ENCOUNTER — Encounter: Payer: Self-pay | Admitting: Family Medicine

## 2016-08-02 ENCOUNTER — Ambulatory Visit (INDEPENDENT_AMBULATORY_CARE_PROVIDER_SITE_OTHER): Payer: 59 | Admitting: Family Medicine

## 2016-08-02 VITALS — BP 104/70 | HR 66 | Temp 98.0°F | Wt 136.6 lb

## 2016-08-02 DIAGNOSIS — Z780 Asymptomatic menopausal state: Secondary | ICD-10-CM | POA: Diagnosis not present

## 2016-08-02 DIAGNOSIS — L659 Nonscarring hair loss, unspecified: Secondary | ICD-10-CM | POA: Diagnosis not present

## 2016-08-02 DIAGNOSIS — S52591A Other fractures of lower end of right radius, initial encounter for closed fracture: Secondary | ICD-10-CM | POA: Diagnosis not present

## 2016-08-02 DIAGNOSIS — Z853 Personal history of malignant neoplasm of breast: Secondary | ICD-10-CM | POA: Diagnosis not present

## 2016-08-02 DIAGNOSIS — E039 Hypothyroidism, unspecified: Secondary | ICD-10-CM

## 2016-08-02 LAB — COMPREHENSIVE METABOLIC PANEL
ALT: 15 U/L (ref 0–35)
AST: 17 U/L (ref 0–37)
Albumin: 4.6 g/dL (ref 3.5–5.2)
Alkaline Phosphatase: 62 U/L (ref 39–117)
BILIRUBIN TOTAL: 0.9 mg/dL (ref 0.2–1.2)
BUN: 16 mg/dL (ref 6–23)
CALCIUM: 9.8 mg/dL (ref 8.4–10.5)
CHLORIDE: 105 meq/L (ref 96–112)
CO2: 30 meq/L (ref 19–32)
CREATININE: 0.82 mg/dL (ref 0.40–1.20)
GFR: 74.71 mL/min (ref >60.00)
Glucose, Bld: 100 mg/dL — ABNORMAL HIGH (ref 70–99)
Potassium: 3.8 mEq/L (ref 3.5–5.1)
SODIUM: 139 meq/L (ref 135–145)
Total Protein: 7.1 g/dL (ref 6.0–8.3)

## 2016-08-02 LAB — TSH: TSH: 0.9 u[IU]/mL (ref 0.35–4.50)

## 2016-08-02 NOTE — Assessment & Plan Note (Signed)
History of breast cancer. She declined oral therapy afterwards. Most recent mammogram benign. Most recent breast exam benign. Continue to monitor.

## 2016-08-02 NOTE — Patient Instructions (Signed)
Nice to see you. We will get lab work today. We'll contact you with results. Please continue to work with your right wrist.

## 2016-08-02 NOTE — Assessment & Plan Note (Signed)
Status post internal fixation. Following with hand surgery. Continue to monitor and continue therapy.

## 2016-08-02 NOTE — Assessment & Plan Note (Addendum)
Hair loss possibly related to her thyroid. Check TSH. Continue Synthroid.

## 2016-08-02 NOTE — Progress Notes (Signed)
  Tommi Rumps, MD Phone: [phone]  Ana Craig is a 66 y.o. female who presents today for follow-up.  Hypothyroidism: Taking Synthroid. Does note she is felt a little draggy recently. At some hair coming out for a couple of months. No skin changes, weight changes, or heat or cold intolerance.  History of breast cancer in 2014. Status post resection. Follow with oncology and general surgery for a year or so though has not followed with them recently. She has kept up with mammograms. Breast exam completed late last year.  She does know she fell while trying to unload a bed and broke her right wrist. She had a surgery with a plate placed. She has followed with the hand surgeon. She is doing therapy and it's getting better. Range of motion is not quite 100%. Does note some numbness just to the radial aspect of where her incision was that has been improving.  PMH: nonsmoker.   ROS see history of present illness  Objective  Physical Exam Vitals:   08/02/16 1455  BP: 104/70  Pulse: 66  Temp: 98 F (36.7 C)    BP Readings from Last 3 Encounters:  08/02/16 104/70  02/29/16 116/62  12/06/15 132/80   Wt Readings from Last 3 Encounters:  08/02/16 136 lb 9.6 oz (62 kg)  02/29/16 140 lb (63.5 kg)  12/06/15 140 lb (63.5 kg)    Physical Exam  Constitutional: No distress.  Cardiovascular: Normal rate, regular rhythm and normal heart sounds.   Pulmonary/Chest: Effort normal and breath sounds normal.  Musculoskeletal: She exhibits no edema.  Wrist is nontender on the right, there is a scar that is well-healed, possible ganglion cyst along the radial aspect of her wrist that is nontender, 5 out of 5 grip strength bilaterally  Neurological: She is alert. Gait normal.  Skin: Skin is warm and dry. She is not diaphoretic.  Scalp appears normal     Assessment/Plan: Please see individual problem list.  Hypothyroidism Hair loss possibly related to her thyroid. Check TSH. Continue  Synthroid.  History of breast cancer History of breast cancer. She declined oral therapy afterwards. Most recent mammogram benign. Most recent breast exam benign. Continue to monitor.  Closed right radial fracture Status post internal fixation. Following with hand surgery. Continue to monitor and continue therapy.   Orders Placed This Encounter  Procedures  . DG Bone Density    Standing Status:   Future    Standing Expiration Date:   10/02/2017    Order Specific Question:   Reason for Exam (SYMPTOM  OR DIAGNOSIS REQUIRED)    Answer:   osteoporosis screening, recent fracture of radius    Order Specific Question:   Preferred imaging location?    Answer:   [facility]  . TSH  . Comp Met (CMET)   Tommi Rumps, MD [place]

## 2016-08-03 ENCOUNTER — Encounter: Payer: Self-pay | Admitting: Family Medicine

## 2016-09-07 ENCOUNTER — Telehealth: Payer: Self-pay | Admitting: Family Medicine

## 2016-09-07 MED ORDER — SYNTHROID 75 MCG PO TABS: 75.0000 ug | ORAL_TABLET | Freq: Every day | ORAL | 0 refills | Status: DC

## 2016-09-07 NOTE — Telephone Encounter (Signed)
Sent to pharmacy 

## 2016-09-07 NOTE — Telephone Encounter (Signed)
Pt called requesting a refill on her SYNTHROID 75 MCG tablet. Pt only has one pill left. Please advise, thank you!  [facility] [address], [place]

## 2016-10-31 ENCOUNTER — Encounter: Payer: Self-pay | Admitting: Family Medicine

## 2016-10-31 ENCOUNTER — Ambulatory Visit (INDEPENDENT_AMBULATORY_CARE_PROVIDER_SITE_OTHER): Payer: 59 | Admitting: Family Medicine

## 2016-10-31 VITALS — BP 120/72 | HR 71 | Temp 98.4°F | Wt 139.0 lb

## 2016-10-31 DIAGNOSIS — S59911A Unspecified injury of right forearm, initial encounter: Secondary | ICD-10-CM | POA: Insufficient documentation

## 2016-10-31 NOTE — Progress Notes (Signed)
  Tommi Rumps, MD Phone: [phone]  Ana Craig is a 66 y.o. female who presents today for same-day visit.  Patient notes 5 days ago she was at the gym and doing shoulder abduction exercises when she felt a stretch in her volar aspect right forearm on the radial side. She noted an area of swelling on the radial aspect down near her wrist at that time. Since then that swelling is going down some though there is a little bit of swelling in the volar aspect of her forearm and then she noticed biceps tendon distally became more prominent and has a nodule. She notes she has been icing it and it has gone down. She's not had any significant pain. She notes a slight stretching sensation when she tries to stretch the arm. She notes no swelling elsewhere in the arm. No history of blood clot.  ROS see history of present illness  Objective  Physical Exam Vitals:   10/31/16 1331  BP: 120/72  Pulse: 71  Temp: 98.4 F (36.9 C)  SpO2: 98%    BP Readings from Last 3 Encounters:  10/31/16 120/72  08/02/16 104/70  02/29/16 116/62   Wt Readings from Last 3 Encounters:  10/31/16 139 lb (63 kg)  08/02/16 136 lb 9.6 oz (62 kg)  02/29/16 140 lb (63.5 kg)    Physical Exam  Constitutional: No distress.  Pulmonary/Chest: Effort normal.  Musculoskeletal:       Arms: Neurological: She is alert.  5/5 strength bilateral biceps, triceps, and grip, sensation intact in hands, 2+ radial pulse on the right  Skin: Skin is warm and dry. She is not diaphoretic.     Assessment/Plan: Please see individual problem list.  Injury of right forearm Suspect muscular or tendinous injury in the right volar forearm given mechanism of injury and subsequent swelling. Given the swelling is focal and it has started to improve doubt blood clot as cause. We will refer to sports medicine to consider ultrasound to evaluate for strain or tear. She'll continue ice. She's given return precautions.   Orders Placed This  Encounter  Procedures  . Ambulatory referral to Sports Medicine    Referral Priority:   Routine    Referral Type:   Consultation    Number of Visits Requested:   [place], MD [place]

## 2016-10-31 NOTE — Patient Instructions (Signed)
Nice to see you. We'll get you set up with sports medicine to evaluate your arm. If it gets worse please let us know.

## 2016-10-31 NOTE — Assessment & Plan Note (Signed)
Suspect muscular or tendinous injury in the right volar forearm given mechanism of injury and subsequent swelling. Given the swelling is focal and it has started to improve doubt blood clot as cause. We will refer to sports medicine to consider ultrasound to evaluate for strain or tear. She'll continue ice. She's given return precautions.

## 2016-11-01 ENCOUNTER — Other Ambulatory Visit: Payer: Self-pay

## 2016-11-01 ENCOUNTER — Ambulatory Visit (INDEPENDENT_AMBULATORY_CARE_PROVIDER_SITE_OTHER): Payer: 59 | Admitting: Sports Medicine

## 2016-11-01 ENCOUNTER — Ambulatory Visit (INDEPENDENT_AMBULATORY_CARE_PROVIDER_SITE_OTHER): Payer: 59

## 2016-11-01 ENCOUNTER — Ambulatory Visit: Payer: Self-pay

## 2016-11-01 ENCOUNTER — Encounter: Payer: Self-pay | Admitting: Sports Medicine

## 2016-11-01 VITALS — BP 110/76 | HR 85 | Ht 65.0 in | Wt 138.6 lb

## 2016-11-01 DIAGNOSIS — L589 Radiodermatitis, unspecified: Secondary | ICD-10-CM | POA: Diagnosis not present

## 2016-11-01 DIAGNOSIS — M7989 Other specified soft tissue disorders: Secondary | ICD-10-CM

## 2016-11-01 DIAGNOSIS — M79631 Pain in right forearm: Secondary | ICD-10-CM

## 2016-11-01 DIAGNOSIS — S52591S Other fractures of lower end of right radius, sequela: Secondary | ICD-10-CM | POA: Diagnosis not present

## 2016-11-01 DIAGNOSIS — Z853 Personal history of malignant neoplasm of breast: Secondary | ICD-10-CM | POA: Diagnosis not present

## 2016-11-01 NOTE — Patient Instructions (Signed)
If you try working out again with your upper body and you notice an increase in your R forearm swelling, you may choose to purchase an over-the-counter compression sleeve.  If you don't notice any of that, then you don't need to worry about purchasing anything.

## 2016-11-01 NOTE — Progress Notes (Signed)
OFFICE VISIT NOTE Ana Craig. Ana Craig, [facility]  Ana Craig - 66 y.o. female MRN [id number]  Date of birth: 08/31/1950  Visit Date: 11/01/2016  PCP: Ana Haven, MD   Referred by: Ana Haven, MD  Ana Craig PT, LAT, ATC acting as scribe for Dr. Paulla Craig.  SUBJECTIVE:   Chief Complaint  Patient presents with  . New Patient (Initial Visit)    R forearm pain   HPI: As below and per problem based documentation when appropriate.  Ana Craig is a new patient presenting today for evaluation of RT forearm pain. She is a RHD female.  Pt states that she injured her R forearm on 10/26/16 at the gym when she was performing R shoulder band aBD when she felt a very intense stretching sensation along the anterior forearm running proximally into the R bicep region.  States that she returned to working out at the gym about a month ago from a prior R distal radius fracture on 03/27/16 with surgery on 04/01/16.  She notes that she didn't have much pain initially nor does she really have pain currently.  Her biggest c/o is swelling along her R distal, lateral forearm and along her antecubital fossa as well as a band of prominent tissue along her distal biceps brachii tendon.  Alleviating factors include icing her R forearm to help decrease the swelling.  Pt reports no specific aggravating factors but notes that she has stopped all upper body exercises at the gym.  She denies any tingling but does have some paresthesias along her R distal, anterior-lateral forearm.        Review of Systems  Constitutional: Negative for chills, fever and weight loss.  HENT: Negative for hearing loss.   Eyes: Negative.   Respiratory: Negative.   Cardiovascular: Negative for chest pain and palpitations.  Gastrointestinal: Negative for heartburn, nausea and vomiting.  Genitourinary: Negative.   Musculoskeletal: Negative for falls and neck pain.   Skin: Negative.   Neurological: Negative for dizziness, tingling and headaches.  Endo/Heme/Allergies: Does not bruise/bleed easily.  Psychiatric/Behavioral: Negative for depression. The patient is not nervous/anxious and does not have insomnia.     Otherwise per HPI.   OBJECTIVE:  VS:  HT:5\' 5"  (165.1 cm)   WT:138 lb 9.6 oz (62.9 kg)  BMI:23.06    BP:110/76  HR:85bpm  TEMP: ( )  RESP:97 %  PHYSICAL EXAM: Constitutional: WDWN, Non-toxic appearing. Psychiatric: Alert & appropriately interactive. Not depressed or anxious appearing. Respiratory: No increased work of breathing. Trachea Midline Eyes: Pupils are equal. EOM intact without nystagmus. No scleral icterus  Extremities overall well aligned.  She has a small amount of swelling across the entire right upper extremity worse in the forearm than in the upper arm.  She has no significant pain with palpation of the epitrochlear lymph nodes and no supraclavicular lymph nodes appreciated.  No axillary lymph nodes.  She does have pain across the dorsum of the wrist.  Prior fracture site is well aligned.  X-rays reviewed today with the patient that are overall reassuring.  Ultrasound per procedure note  ASSESSMENT & PLAN:   1. Right forearm pain   2. Pain and swelling of right forearm   3. History of breast cancer   4. Radiation dermatitis   5. Other closed fracture of distal end of right radius, sequela    PLAN: See below, recommend compression and relative rest.  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  There is no evidence of hardware loosening and I suspect was the end of the symptoms are secondary to lymphedema that  was likely subclinical until trying to increase her activities following her  fracture.   ++++++++++++++++++++++++++++++++++++++++++++ Orders & Meds: Orders Placed This Encounter  Procedures  . [place] LIMITED JOINT SPACE STRUCTURES UP RIGHT(NO LINKED CHARGES)  . DG Wrist 2 Views Right    No orders of the defined types were placed in this encounter.   ++++++++++++++++++++++++++++++++++++++++++++ Follow-up: Return if symptoms worsen or fail to improve.   Pertinent documentation may be included in additional procedure notes, imaging studies, problem based documentation and patient instructions. Please see these sections of the encounter for additional information regarding this visit. CMA/ATC served as Education administrator during this visit. History, Physical, and Plan performed by medical provider. Documentation and orders reviewed and attested to.      Ana Craig, [place] [facility] Physician

## 2016-11-23 ENCOUNTER — Other Ambulatory Visit: Payer: Self-pay | Admitting: General Surgery

## 2016-11-23 DIAGNOSIS — Z853 Personal history of malignant neoplasm of breast: Secondary | ICD-10-CM

## 2016-12-03 NOTE — Assessment & Plan Note (Signed)
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

## 2016-12-03 NOTE — Assessment & Plan Note (Signed)
Suspect some component of worsening lymphedema secondary to her wrist fracture.  There is no evidence of hardware loosening and I suspect was the end of the symptoms are secondary to lymphedema that  was likely subclinical until trying to increase her activities following her fracture.

## 2016-12-05 ENCOUNTER — Encounter: Payer: 59 | Admitting: Family Medicine

## 2016-12-06 ENCOUNTER — Encounter: Payer: Self-pay | Admitting: Family Medicine

## 2016-12-06 ENCOUNTER — Ambulatory Visit (INDEPENDENT_AMBULATORY_CARE_PROVIDER_SITE_OTHER): Payer: 59 | Admitting: Family Medicine

## 2016-12-06 VITALS — BP 126/88 | HR 77 | Temp 97.8°F | Ht 66.5 in | Wt 139.0 lb

## 2016-12-06 DIAGNOSIS — Z23 Encounter for immunization: Secondary | ICD-10-CM

## 2016-12-06 DIAGNOSIS — Z130 Encounter for screening for diseases of the blood and blood-forming organs and certain disorders involving the immune mechanism: Secondary | ICD-10-CM | POA: Diagnosis not present

## 2016-12-06 DIAGNOSIS — Z131 Encounter for screening for diabetes mellitus: Secondary | ICD-10-CM

## 2016-12-06 DIAGNOSIS — Z0000 Encounter for general adult medical examination without abnormal findings: Secondary | ICD-10-CM | POA: Diagnosis not present

## 2016-12-06 DIAGNOSIS — E039 Hypothyroidism, unspecified: Secondary | ICD-10-CM | POA: Diagnosis not present

## 2016-12-06 DIAGNOSIS — Z13220 Encounter for screening for lipoid disorders: Secondary | ICD-10-CM

## 2016-12-06 LAB — COMPREHENSIVE METABOLIC PANEL
ALT: 19 U/L (ref 0–35)
AST: 19 U/L (ref 0–37)
Albumin: 4.2 g/dL (ref 3.5–5.2)
Alkaline Phosphatase: 71 U/L (ref 39–117)
BILIRUBIN TOTAL: 0.7 mg/dL (ref 0.2–1.2)
BUN: 18 mg/dL (ref 6–23)
CHLORIDE: 104 meq/L (ref 96–112)
CO2: 29 mEq/L (ref 19–32)
CREATININE: 0.77 mg/dL (ref 0.40–1.20)
Calcium: 9.6 mg/dL (ref 8.4–10.5)
GFR: 80.25 mL/min (ref >60.00)
Glucose, Bld: 90 mg/dL (ref 70–99)
Potassium: 4.2 mEq/L (ref 3.5–5.1)
SODIUM: 140 meq/L (ref 135–145)
Total Protein: 7.2 g/dL (ref 6.0–8.3)

## 2016-12-06 LAB — TSH: TSH: 1.85 u[IU]/mL (ref 0.35–4.50)

## 2016-12-06 LAB — CBC
HCT: 39.1 % (ref 36.0–46.0)
Hemoglobin: 13 g/dL (ref 12.0–15.0)
MCHC: 33.4 g/dL (ref 30.0–36.0)
MCV: 94.1 fl (ref 78.0–100.0)
Platelets: 312 10*3/uL (ref 150.0–400.0)
RBC: 4.15 Mil/uL (ref 3.87–5.11)
RDW: 13.1 % (ref 11.5–15.5)
WBC: 3.9 10*3/uL — AB (ref 4.0–10.5)

## 2016-12-06 LAB — LIPID PANEL
Cholesterol: 171 mg/dL (ref 0–200)
HDL: 56.6 mg/dL (ref >39.00)
LDL CALC: 104 mg/dL — AB (ref 0–99)
NONHDL: 114.33
Total CHOL/HDL Ratio: 3
Triglycerides: 54 mg/dL (ref 0.0–149.0)
VLDL: 10.8 mg/dL (ref 0.0–40.0)

## 2016-12-06 LAB — HEMOGLOBIN A1C: HEMOGLOBIN A1C: 5.6 % (ref 4.6–6.5)

## 2016-12-06 NOTE — Assessment & Plan Note (Signed)
Physical exam completed. Encouraged continued diet and exercise. Pelvic exam and breast exam through gynecology. Mammogram rescheduled. Encouraged to monitor the swelling in her forearm. This should continue to improve. The nodule as previously noted is significantly reduced in size. Advised that if this does not continue to reduce or if it enlarges again she needs to let us know quickly. Lab work as outlined below. Local reaction noted from flu shot. She'll monitor this area. Discussed return precautions.

## 2016-12-06 NOTE — Patient Instructions (Signed)
Nice to see you. Please continue to work on diet and exercise. Please see the gynecologist. Please get your mammogram as scheduled. Please monitor the prior area of concern at your right elbow. If this does not continue to improve please let us know.

## 2016-12-06 NOTE — Progress Notes (Signed)
Tommi Rumps, MD Phone: [phone]  Ana Craig is a 66 y.o. female who presents today for physical exam  Exercising by lifting weights and doing ab workouts. Diet is very healthy with vegetables and protein. History of hysterectomy in the past related to a cyst. She sees gynecology for her pelvic exams. Also sees them for breast exams. Mammogram scheduled in the next several weeks. Up-to-date on tetanus vaccine, colonoscopy, hepatitis C, and HIV testing. No tobacco use, alcohol use, or illicit drug use.  She sees a Pharmacist, community and ophthalmologist.  She saw sports medicine for the area of concern in her right antecubital fossa. They did an ultrasound and she reports they advised it was a lymph node. She notes the area has improved significantly. The swelling is improved as well.  Active Ambulatory Problems    Diagnosis Date Noted  . History of breast cancer 12/16/2012  . Hypothyroidism 07/17/2014  . Elevated glucose 07/17/2014  . 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 Routine general medical examination at a health care facility 12/03/2014  . Headache 12/06/2015  . Rib pain on right side 12/06/2015  . Closed right radial fracture - S/p ORIF - [facility]  05/02/2016  . Injury of right forearm 10/31/2016  . Pain and swelling of right forearm 11/01/2016   Resolved Ambulatory Problems    Diagnosis Date Noted  . Encounter to establish care 07/26/2014  . Vaginal discharge 12/03/2014  . Encounter for general adult medical examination with abnormal findings 12/06/2015   Past Medical History:  Diagnosis Date  . Arthritis   . Basal cell carcinoma 02/06/2014  . Breast cancer ([place]) 12/25/12  . Hypothyroidism   . Osteoporosis   . PONV (postoperative nausea and vomiting)   . Wears glasses     Family History  Problem Relation Age of Onset  . COPD Mother     Social History   Social History  . Marital status: Married    Spouse name: N/A  . Number of children: N/A  . Years of  education: N/A   Occupational History  . Not on file.   Social History Main Topics  . Smoking status: Never Smoker  . Smokeless tobacco: Never Used  . Alcohol use No  . Drug use: No  . Sexual activity: Not Currently    Partners: Male   Other Topics Concern  . Not on file   Social History Narrative  . No narrative on file    ROS  General:  Negative for nexplained weight loss, fever Skin: Negative for new or changing mole, sore that won't heal HEENT: Negative for trouble hearing, trouble seeing, ringing in ears, mouth sores, hoarseness, change in voice, dysphagia. CV:  Negative for chest pain, dyspnea, edema, palpitations Resp: Negative for cough, dyspnea, hemoptysis GI: Negative for nausea, vomiting, diarrhea, constipation, abdominal pain, melena, hematochezia. GU: Negative for dysuria, incontinence, urinary hesitance, hematuria, vaginal or penile discharge, polyuria, sexual difficulty, lumps in testicle or breasts MSK: Negative for muscle cramps or aches, joint pain or swelling Neuro: Negative for headaches, weakness, numbness, dizziness, passing out/fainting Psych: Negative for depression, anxiety, memory problems  Objective  Physical Exam Vitals:   12/06/16 1128  BP: 126/88  Pulse: 77  Temp: 97.8 F (36.6 C)  SpO2: 98%    BP Readings from Last 3 Encounters:  12/06/16 126/88  11/01/16 110/76  10/31/16 120/72   Wt Readings from Last 3 Encounters:  12/06/16 139 lb (63 kg)  11/01/16 138 lb 9.6 oz (62.9 kg)  10/31/16 139 lb (63  kg)    Physical Exam  Constitutional: No distress.  HENT:  Head: Normocephalic and atraumatic.  Mouth/Throat: Oropharynx is clear and moist. No oropharyngeal exudate.  Eyes: Pupils are equal, round, and reactive to light. Conjunctivae are normal.  Neck: Neck supple.  Cardiovascular: Normal rate, regular rhythm and normal heart sounds.   Pulmonary/Chest: Effort normal and breath sounds normal.  Abdominal: Soft. Bowel sounds are  normal. She exhibits no distension. There is no tenderness. There is no rebound and no guarding.  Genitourinary:  Genitourinary Comments: Patient deferred pelvic and breast exam to gynecology  Musculoskeletal: She exhibits no edema.  Swelling significantly improved and right forearm, the area of concern near her biceps tendon has significantly reduced in size. Minimally present currently. Nontender.  Lymphadenopathy:    She has no cervical adenopathy.  Neurological: She is alert. Gait normal.  Skin: Skin is warm and dry. She is not diaphoretic.  Psychiatric: Mood and affect normal.  Patient did have slight local reaction at the site of her flu shot with some mild erythema and warmth, no swelling, no tenderness   Assessment/Plan:   Routine general medical examination at a health care facility Physical exam completed. Encouraged continued diet and exercise. Pelvic exam and breast exam through gynecology. Mammogram rescheduled. Encouraged to monitor the swelling in her forearm. This should continue to improve. The nodule as previously noted is significantly reduced in size. Advised that if this does not continue to reduce or if it enlarges again she needs to let us know quickly. Lab work as outlined below. Local reaction noted from flu shot. She'll monitor this area. Discussed return precautions.   Orders Placed This Encounter  Procedures  . Flu Vaccine QUAD 36+ mos IM  . CBC  . Comp Met (CMET)  . HgB A1c  . Lipid panel  . TSH    No orders of the defined types were placed in this encounter.    Tommi Rumps, MD [place]

## 2016-12-07 ENCOUNTER — Telehealth: Payer: Self-pay

## 2016-12-07 DIAGNOSIS — D72819 Decreased white blood cell count, unspecified: Secondary | ICD-10-CM

## 2016-12-07 NOTE — Telephone Encounter (Signed)
Patient notified and lab order placed. Patient is scheduled for lab

## 2016-12-07 NOTE — Telephone Encounter (Signed)
-----   Message from [name], MD sent at 12/06/2016  5:50 PM EDT ----- Please let the patient know her white blood cell count is very minimally low. We should recheck this in several weeks. You can place an order for CBC with differential to be cosigned to me with the diagnosis of unspecified leukopenia. Her other lab works acceptable. Thanks.

## 2016-12-11 ENCOUNTER — Ambulatory Visit
Admission: RE | Admit: 2016-12-11 | Discharge: 2016-12-11 | Disposition: A | Discharge status: Home / Self Care | Payer: 59 | Source: Ambulatory Visit | Attending: General Surgery | Admitting: General Surgery

## 2016-12-11 DIAGNOSIS — Z853 Personal history of malignant neoplasm of breast: Secondary | ICD-10-CM

## 2016-12-11 HISTORY — DX: Personal history of irradiation: Z92.3

## 2017-01-04 ENCOUNTER — Other Ambulatory Visit: Payer: 59

## 2017-01-07 ENCOUNTER — Other Ambulatory Visit: Payer: Self-pay | Admitting: Family Medicine

## 2017-01-14 ENCOUNTER — Encounter: Payer: Self-pay | Admitting: Sports Medicine

## 2017-01-23 ENCOUNTER — Other Ambulatory Visit (INDEPENDENT_AMBULATORY_CARE_PROVIDER_SITE_OTHER): Payer: 59

## 2017-01-23 DIAGNOSIS — D72819 Decreased white blood cell count, unspecified: Secondary | ICD-10-CM

## 2017-01-23 LAB — CBC WITH DIFFERENTIAL/PLATELET
BASOS ABS: 0 10*3/uL (ref 0.0–0.1)
BASOS PCT: 1 % (ref 0.0–3.0)
EOS ABS: 0.1 10*3/uL (ref 0.0–0.7)
Eosinophils Relative: 2.6 % (ref 0.0–5.0)
HEMATOCRIT: 41.1 % (ref 36.0–46.0)
HEMOGLOBIN: 14.1 g/dL (ref 12.0–15.0)
LYMPHS PCT: 26.2 % (ref 12.0–46.0)
Lymphs Abs: 1.3 10*3/uL (ref 0.7–4.0)
MCHC: 34.4 g/dL (ref 30.0–36.0)
MCV: 94.4 fl (ref 78.0–100.0)
Monocytes Absolute: 0.3 10*3/uL (ref 0.1–1.0)
Monocytes Relative: 5.9 % (ref 3.0–12.0)
Neutro Abs: 3.3 10*3/uL (ref 1.4–7.7)
Neutrophils Relative %: 64.3 % (ref 43.0–77.0)
Platelets: 349 10*3/uL (ref 150.0–400.0)
RBC: 4.36 Mil/uL (ref 3.87–5.11)
RDW: 13.2 % (ref 11.5–15.5)
WBC: 5.1 10*3/uL (ref 4.0–10.5)

## 2017-01-25 ENCOUNTER — Encounter: Payer: Self-pay | Admitting: Obstetrics and Gynecology

## 2017-01-25 ENCOUNTER — Ambulatory Visit (INDEPENDENT_AMBULATORY_CARE_PROVIDER_SITE_OTHER): Payer: 59 | Admitting: Obstetrics and Gynecology

## 2017-01-25 VITALS — BP 116/74 | Ht 66.0 in | Wt 140.0 lb

## 2017-01-25 DIAGNOSIS — Z853 Personal history of malignant neoplasm of breast: Secondary | ICD-10-CM | POA: Diagnosis not present

## 2017-01-25 DIAGNOSIS — Z1231 Encounter for screening mammogram for malignant neoplasm of breast: Secondary | ICD-10-CM | POA: Diagnosis not present

## 2017-01-25 DIAGNOSIS — N898 Other specified noninflammatory disorders of vagina: Secondary | ICD-10-CM

## 2017-01-25 DIAGNOSIS — Z01419 Encounter for gynecological examination (general) (routine) without abnormal findings: Secondary | ICD-10-CM

## 2017-01-25 DIAGNOSIS — Z1239 Encounter for other screening for malignant neoplasm of breast: Secondary | ICD-10-CM

## 2017-01-25 LAB — POCT WET PREP WITH KOH
CLUE CELLS WET PREP PER HPF POC: NEGATIVE
KOH Prep POC: NEGATIVE
TRICHOMONAS UA: NEGATIVE
Yeast Wet Prep HPF POC: NEGATIVE

## 2017-01-25 NOTE — Progress Notes (Signed)
PCP: [name], MD   Chief Complaint  Patient presents with  . Annual Exam    HPI:      [name] is a 66 y.o. No obstetric history on file. who LMP was No LMP recorded. Patient has had a hysterectomy., presents today for her annual examination.  Her menses are absent due to menopause/hyst. She does not have intermenstrual bleeding.  She does not have vasomotor sx.  Sex activity: not sexually active. She does not have vaginal dryness. She has noticed increased yellow vag d/c recently, worse at end of the day. No itch/irritation/odor. She wears pantyliners.   Last Pap: 01/24/16  Results were: no abnormalities /neg HPV DNA.  Hx of STDs: none  Last mammogram: December 11, 2016  Results were: normal--routine follow-up in 12 months There is no FH of breast cancer. There is no FH of ovarian cancer. The patient does do self-breast exams. Pt is s/p RT breast stage 0 breast cancer 2014 with lumpectomy and radiation. Pt declined tamoxifen tx. Just doing yearly mammos now.  Colonoscopy: colonoscopy 1 years ago without abnormalities. . Repeat due after 10 years.   Tobacco use: The patient denies current or previous tobacco use. Alcohol use: none Exercise: moderately active  She does get adequate calcium and Vitamin D in her diet.  Labs with PCP.   Past Medical History:  Diagnosis Date  . Arthritis   . Basal cell carcinoma 02/06/2014   Skin of chest  . 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 Hypothyroidism   . Osteoporosis   . Personal history of radiation therapy   . PONV (postoperative nausea and vomiting)   . Wears glasses     Past Surgical History:  Procedure Laterality Date  . ABDOMINAL HYSTERECTOMY  1998  . APPENDECTOMY  2002   lap  . BREAST LUMPECTOMY WITH NEEDLE LOCALIZATION Right 12/25/2012   Procedure: BREAST LUMPECTOMY WITH NEEDLE LOCALIZATION;  Surgeon: Edward Jolly, MD;  Location: [facility];   Service: General;  Laterality: Right;  . COLONOSCOPY    . COLONOSCOPY WITH PROPOFOL N/A 02/29/2016   Procedure: COLONOSCOPY WITH PROPOFOL;  Surgeon: Garlan Fair, MD;  Location: [facility];  Service: Endoscopy;  Laterality: N/A;  . OPEN REDUCTION INTERNAL FIXATION (ORIF) DISTAL RADIAL FRACTURE Right 04/2016   [facility]  . TONSILLECTOMY      Family History  Problem Relation Age of Onset  . COPD Mother     Social History   Socioeconomic History  . Marital status: Married    Spouse name: Not on file  . Number of children: Not on file  . Years of education: Not on file  . Highest education level: Not on file  Social Needs  . Financial resource strain: Not on file  . Food insecurity - worry: Not on file  . Food insecurity - inability: Not on file  . Transportation needs - medical: Not on file  . Transportation needs - non-medical: Not on file  Occupational History  . Not on file  Tobacco Use  . Smoking status: Never Smoker  . Smokeless tobacco: Never Used  Substance and Sexual Activity  . Alcohol use: No    Alcohol/week: 0.0 oz  . Drug use: No  . Sexual activity: Not Currently    Partners: Male    Birth control/protection: Post-menopausal  Other Topics Concern  . Not on file  Social History Narrative  . Not on file  Current Meds  Medication Sig  . calcium carbonate (OS-CAL) 600 MG TABS tablet Take 600 mg by mouth 2 (two) times daily with a meal.  . GLUCOSAMINE-CHONDROITIN PO Take 1 tablet by mouth daily.  . Multiple Vitamins-Minerals (MULTIVITAMIN WITH MINERALS) tablet Take 1 tablet by mouth daily.  Marland Kitchen SYNTHROID 75 MCG tablet Take 1 tablet (75 mcg total) by mouth daily.  Marland Kitchen SYNTHROID 75 MCG tablet TAKE 1 TABLET BY MOUTH ONCE DAILY      ROS:  Review of Systems  Constitutional: Negative for fatigue, fever and unexpected weight change.  Respiratory: Negative for cough, shortness of breath and wheezing.   Cardiovascular: Negative for chest pain, palpitations  and leg swelling.  Gastrointestinal: Negative for blood in stool, constipation, diarrhea, nausea and vomiting.  Endocrine: Negative for cold intolerance, heat intolerance and polyuria.  Genitourinary: Negative for dyspareunia, dysuria, flank pain, frequency, genital sores, hematuria, menstrual problem, pelvic pain, urgency, vaginal bleeding, vaginal discharge and vaginal pain.  Musculoskeletal: Negative for back pain, joint swelling and myalgias.  Skin: Negative for rash.  Neurological: Negative for dizziness, syncope, light-headedness, numbness and headaches.  Hematological: Negative for adenopathy.  Psychiatric/Behavioral: Negative for agitation, confusion, sleep disturbance and suicidal ideas. The patient is not nervous/anxious.      Objective: BP 116/74   Ht 5\' 6"  (1.676 m)   Wt 140 lb (63.5 kg)   BMI 22.60 kg/m    Physical Exam  Constitutional: She is oriented to person, place, and time. She appears well-developed and well-nourished.  Genitourinary: Vagina normal. There is no rash or tenderness on the right labia. There is no rash or tenderness on the left labia. No erythema or tenderness in the vagina. No vaginal discharge found. Right adnexum does not display mass and does not display tenderness. Left adnexum does not display mass and does not display tenderness.  Genitourinary Comments: UTERUS/CX SURG REM  Neck: Normal range of motion. No thyromegaly present.  Cardiovascular: Normal rate, regular rhythm and normal heart sounds.  No murmur heard. Pulmonary/Chest: Effort normal and breath sounds normal. Right breast exhibits no mass, no nipple discharge, no skin change and no tenderness. Left breast exhibits no mass, no nipple discharge, no skin change and no tenderness.  Abdominal: Soft. There is no tenderness. There is no guarding.  Musculoskeletal: Normal range of motion.  Neurological: She is alert and oriented to person, place, and time. No cranial nerve deficit.    Psychiatric: She has a normal mood and affect. Her behavior is normal.  Vitals reviewed.   Results: Results for orders placed or performed in visit on 01/25/17 (from the past 24 hour(s))  POCT Wet Prep with KOH     Status: Normal   Collection Time: 01/25/17 10:53 AM  Result Value Ref Range   Trichomonas, UA Negative    Clue Cells Wet Prep HPF POC neg    Epithelial Wet Prep HPF POC  Few, Moderate, Many, Too numerous to count   Yeast Wet Prep HPF POC neg    Bacteria Wet Prep HPF POC  Few   RBC Wet Prep HPF POC     WBC Wet Prep HPF POC     KOH Prep POC Negative Negative    Assessment/Plan:  Encounter for annual routine gynecological examination  Screening for breast cancer - Pt due for dx mammo 11/19 and will sched. - Plan: MM DIAG BREAST TOMO BILATERAL, CANCELED: MM DIGITAL SCREENING BILATERAL  Vaginal discharge - Neg wet prep. Reassurance. F/u prn.  - Plan: POCT Wet  Prep with KOH  History of breast cancer - Plan: MM DIAG BREAST TOMO BILATERAL         GYN counsel breast self exam, mammography screening, menopause, adequate intake of calcium and vitamin D, diet and exercise    F/U  Return in about 1 year (around 01/25/2018).  Ana Craig B. Anaija Wissink, PA-C 01/25/2017 10:54 AM

## 2017-01-25 NOTE — Patient Instructions (Signed)
I value your feedback and entrusting us with your care. If you get a [facility] patient survey, I would appreciate you taking the time to let us know about your experience today. Thank you! 

## 2017-04-12 ENCOUNTER — Other Ambulatory Visit: Payer: Self-pay | Admitting: Family Medicine

## 2017-07-30 ENCOUNTER — Encounter: Payer: Self-pay | Admitting: Internal Medicine

## 2017-07-30 ENCOUNTER — Ambulatory Visit: Payer: 59 | Admitting: Internal Medicine

## 2017-07-30 ENCOUNTER — Ambulatory Visit: Payer: Self-pay

## 2017-07-30 VITALS — BP 112/76 | HR 66 | Temp 98.1°F | Wt 124.0 lb

## 2017-07-30 DIAGNOSIS — L255 Unspecified contact dermatitis due to plants, except food: Secondary | ICD-10-CM

## 2017-07-30 MED ORDER — PREDNISONE 10 MG PO TABS: ORAL_TABLET | ORAL | 0 refills | Status: DC

## 2017-07-30 MED ORDER — METHYLPREDNISOLONE ACETATE 80 MG/ML IJ SUSP
80.0000 mg | Freq: Once | INTRAMUSCULAR | Status: AC
  Administered 2017-07-30: 80 mg via INTRAMUSCULAR

## 2017-07-30 NOTE — Telephone Encounter (Signed)
Patient going to UC

## 2017-07-30 NOTE — Telephone Encounter (Signed)
Pt. States she was exposed to "something that caused a rash 2 weeks ago." States she is allergic to geraniums. "This looks like poison ivy - lots of blisters on both arms " from fingers to shoulders. States left hand is swollen. "I'm afraid it's going to get infected." No availability in office. Will go to UC today.  Reason for Disposition . [1] Rash is painful to touch AND [2] fever  Answer Assessment - Initial Assessment Questions 1. APPEARANCE of RASH: "Describe the rash."      Blisters, red skin 2. LOCATION: "Where is the rash located?"      Both arms 3. NUMBER: "How many spots are there?"      Numerous 4. SIZE: "How big are the spots?" (Inches, centimeters or compare to size of a coin)      Large 5. ONSET: "When did the rash start?"       2 weeks ago 6. ITCHING: "Does the rash itch?" If so, ask: "How bad is the itch?"  (Scale 1-10; or mild, moderate, severe)     9 7. PAIN: "Does the rash hurt?" If so, ask: "How bad is the pain?"  (Scale 1-10; or mild, moderate, severe)     No 8. OTHER SYMPTOMS: "Do you have any other symptoms?" (e.g., fever)     No 9. PREGNANCY: "Is there any chance you are pregnant?" "When was your last menstrual period?"     N/a  Protocols used: RASH OR REDNESS - LOCALIZED-A-AH

## 2017-07-30 NOTE — Progress Notes (Signed)
Subjective:    Patient ID: Ana Craig, female    DOB: [birth year], 67 y.o.   MRN: [id number]  HPI  Pt presents to the clinic today with c/o a rash. She noticed this 2 weeks ago after working out in the yard. It is located on her trunk, shoulders and hands. The rash is itchy, swollen and hot. She has tried Oncologist, oral and topical Benadryl and Caladryl with minimal relief. She has had poison ivy in the past and thinks this is the same. She denies changes in soaps, lotions or detergents.  Review of Systems  Past Medical History:  Diagnosis Date  . Arthritis   . Basal cell carcinoma 02/06/2014   Skin of chest  . 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 Hypothyroidism   . Osteoporosis   . Personal history of radiation therapy   . PONV (postoperative nausea and vomiting)   . Wears glasses     Current Outpatient Medications  Medication Sig Dispense Refill  . calcium carbonate (OS-CAL) 600 MG TABS tablet Take 600 mg by mouth 2 (two) times daily with a meal.    . GLUCOSAMINE-CHONDROITIN PO Take 1 tablet by mouth daily.    . Multiple Vitamins-Minerals (MULTIVITAMIN WITH MINERALS) tablet Take 1 tablet by mouth daily.    Marland Kitchen SYNTHROID 75 MCG tablet Take 1 tablet (75 mcg total) by mouth daily. 90 tablet 0  . SYNTHROID 75 MCG tablet TAKE 1 TABLET BY MOUTH ONCE DAILY 90 tablet 1   No current facility-administered medications for this visit.     Allergies  Allergen Reactions  . Codeine Other (See Comments)  . Penicillins Rash    Has patient had a PCN reaction causing immediate rash, facial/tongue/throat swelling, SOB or lightheadedness with hypotension: {unknown Has patient had a PCN reaction causing severe rash involving mucus membranes or skin necrosis: {no Has patient had a PCN reaction that required hospitalization {no Has patient had a PCN reaction occurring within the last 10 years: {unknown If all of the above answers are "NO", then may proceed with  Cephalosporin use.    Family History  Problem Relation Age of Onset  . COPD Mother     Social History   Socioeconomic History  . Marital status: Married    Spouse name: Not on file  . Number of children: Not on file  . Years of education: Not on file  . Highest education level: Not on file  Occupational History  . Not on file  Social Needs  . Financial resource strain: Not on file  . Food insecurity:    Worry: Not on file    Inability: Not on file  . Transportation needs:    Medical: Not on file    Non-medical: Not on file  Tobacco Use  . Smoking status: Never Smoker  . Smokeless tobacco: Never Used  Substance and Sexual Activity  . Alcohol use: No    Alcohol/week: 0.0 oz  . Drug use: No  . Sexual activity: Not Currently    Partners: Male    Birth control/protection: Post-menopausal  Lifestyle  . Physical activity:    Days per week: Not on file    Minutes per session: Not on file  . Stress: Not on file  Relationships  . Social connections:    Talks on phone: Not on file    Gets together: Not on file    Attends religious service: Not on file    Active member  of club or organization: Not on file    Attends meetings of clubs or organizations: Not on file    Relationship status: Not on file  . Intimate partner violence:    Fear of current or ex partner: Not on file    Emotionally abused: Not on file    Physically abused: Not on file    Forced sexual activity: Not on file  Other Topics Concern  . Not on file  Social History Narrative  . Not on file     Constitutional: Denies fever, malaise, fatigue, headache or abrupt weight changes.  Skin: Pt reports rash. Denies ulcercations.    No other specific complaints in a complete review of systems (except as listed in HPI above).     Objective:   Physical Exam    BP 112/76   Pulse 66   Temp 98.1 F (36.7 C) (Oral)   Wt 124 lb (56.2 kg)   SpO2 98%   BMI 20.01 kg/m  Wt Readings from Last 3  Encounters:  07/30/17 124 lb (56.2 kg)  01/25/17 140 lb (63.5 kg)  12/06/16 139 lb (63 kg)    General: Appears her stated age, well developed, well nourished in NAD. Skin: Convalescent sandpaper rash of right arm with intermittent vesicular lesions. Warm to touch but non tender. Scattered lesions noted on left arm and upper trunk.  BMET    Component Value Date/Time   NA 140 12/06/2016 1157   NA 143 04/18/2013 0938   K 4.2 12/06/2016 1157   K 4.1 04/18/2013 0938   CL 104 12/06/2016 1157   CO2 29 12/06/2016 1157   CO2 27 04/18/2013 0938   GLUCOSE 90 12/06/2016 1157   GLUCOSE 72 04/18/2013 0938   BUN 18 12/06/2016 1157   BUN 13.0 04/18/2013 0938   CREATININE 0.77 12/06/2016 1157   CREATININE 0.9 04/18/2013 0938   CALCIUM 9.6 12/06/2016 1157   CALCIUM 10.1 04/18/2013 0938    Lipid Panel     Component Value Date/Time   CHOL 171 12/06/2016 1157   TRIG 54.0 12/06/2016 1157   HDL 56.60 12/06/2016 1157   CHOLHDL 3 12/06/2016 1157   VLDL 10.8 12/06/2016 1157   LDLCALC 104 (H) 12/06/2016 1157    CBC    Component Value Date/Time   WBC 5.1 01/23/2017 0936   RBC 4.36 01/23/2017 0936   HGB 14.1 01/23/2017 0936   HGB 13.9 04/18/2013 0938   HCT 41.1 01/23/2017 0936   HCT 40.9 04/18/2013 0938   PLT 349.0 01/23/2017 0936   PLT 310 04/18/2013 0938   MCV 94.4 01/23/2017 0936   MCV 93.3 04/18/2013 0938   MCH 31.6 04/18/2013 0938   MCHC 34.4 01/23/2017 0936   RDW 13.2 01/23/2017 0936   RDW 13.0 04/18/2013 0938   LYMPHSABS 1.3 01/23/2017 0936   LYMPHSABS 1.0 04/18/2013 0938   MONOABS 0.3 01/23/2017 0936   MONOABS 0.4 04/18/2013 0938   EOSABS 0.1 01/23/2017 0936   EOSABS 0.1 04/18/2013 0938   EOSABS 0.1 03/14/2013 0939   BASOSABS 0.0 01/23/2017 0936   BASOSABS 0.1 04/18/2013 0938    Hgb A1C Lab Results  Component Value Date   HGBA1C 5.6 12/06/2016          Assessment & Plan:   Contact Dermatitis due to Plant:    80 mg Depo IM today eRx for Pred Taper- start  tomorrow Continue Benadryl or Zyrtec OTC as needed for itching  Return precautions discussed Webb Silversmith, NP

## 2017-07-30 NOTE — Patient Instructions (Signed)
Contact Dermatitis Dermatitis is redness, soreness, and swelling (inflammation) of the skin. Contact dermatitis is a reaction to certain substances that touch the skin. You either touched something that irritated your skin, or you have allergies to something you touched. Follow these instructions at home: Skin Care  Moisturize your skin as needed.  Apply cool compresses to the affected areas.  Try taking a bath with: ? Epsom salts. Follow the instructions on the package. You can get these at a pharmacy or grocery store. ? Baking soda. Pour a small amount into the bath as told by your doctor. ? Colloidal oatmeal. Follow the instructions on the package. You can get this at a pharmacy or grocery store.  Try applying baking soda paste to your skin. Stir water into baking soda until it looks like paste.  Do not scratch your skin.  Bathe less often.  Bathe in lukewarm water. Avoid using hot water. Medicines  Take or apply over-the-counter and prescription medicines only as told by your doctor.  If you were prescribed an antibiotic medicine, take or apply your antibiotic as told by your doctor. Do not stop taking the antibiotic even if your condition starts to get better. General instructions  Keep all follow-up visits as told by your doctor. This is important.  Avoid the substance that caused your reaction. If you do not know what caused it, keep a journal to try to track what caused it. Write down: ? What you eat. ? What cosmetic products you use. ? What you drink. ? What you wear in the affected area. This includes jewelry.  If you were given a bandage (dressing), take care of it as told by your doctor. This includes when to change and remove it. Contact a doctor if:  You do not get better with treatment.  Your condition gets worse.  You have signs of infection such as: ? Swelling. ? Tenderness. ? Redness. ? Soreness. ? Warmth.  You have a fever.  You have new  symptoms. Get help right away if:  You have a very bad headache.  You have neck pain.  Your neck is stiff.  You throw up (vomit).  You feel very sleepy.  You see red streaks coming from the affected area.  Your bone or joint underneath the affected area becomes painful after the skin has healed.  The affected area turns darker.  You have trouble breathing. This information is not intended to replace advice given to you by your health care provider. Make sure you discuss any questions you have with your health care provider. Document Released: 11/20/2008 Document Revised: 07/01/2015 Document Reviewed: 06/10/2014 Elsevier Interactive Patient Education  2018 Elsevier Inc.  

## 2017-07-30 NOTE — Addendum Note (Signed)
Addended by: Lurlean Nanny on: 07/30/2017 04:26 PM   Modules accepted: Orders

## 2017-08-15 ENCOUNTER — Ambulatory Visit (INDEPENDENT_AMBULATORY_CARE_PROVIDER_SITE_OTHER): Payer: 59 | Admitting: Obstetrics and Gynecology

## 2017-08-15 ENCOUNTER — Encounter: Payer: Self-pay | Admitting: Obstetrics and Gynecology

## 2017-08-15 VITALS — BP 122/74 | HR 73 | Ht 66.0 in | Wt 123.0 lb

## 2017-08-15 DIAGNOSIS — N898 Other specified noninflammatory disorders of vagina: Secondary | ICD-10-CM | POA: Diagnosis not present

## 2017-08-15 DIAGNOSIS — N760 Acute vaginitis: Secondary | ICD-10-CM

## 2017-08-15 DIAGNOSIS — B9689 Other specified bacterial agents as the cause of diseases classified elsewhere: Secondary | ICD-10-CM | POA: Diagnosis not present

## 2017-08-15 LAB — POCT WET PREP WITH KOH
CLUE CELLS WET PREP PER HPF POC: NEGATIVE
KOH Prep POC: NEGATIVE
Trichomonas, UA: NEGATIVE
Yeast Wet Prep HPF POC: NEGATIVE

## 2017-08-15 NOTE — Progress Notes (Signed)
[name], MD   Chief Complaint  Patient presents with  . Vaginitis    Some viginal itching, tried monistat 7 day, burned with application, yelowish discharge     HPI:      [name] is a 67 y.o. G0P0000 who LMP was No LMP recorded. Patient has had a hysterectomy., presents today for increased vag d/c with itch/irritation, no odor recently. Sx started after prednisone tx for a rash. She treated with monistat-7 with sx improvement. Last treated 5 days ago. Hx of yellow d/c at 12/18 annual with neg wet prep. D/C persisted and then became heavier and yellow/green with vaginal itching. D/c returning to normal after monistat tx. No recent abx use. No LBP, belly pain, fevers. No urin sx except occas decreased flow recently. S/p hyst.   Past Medical History:  Diagnosis Date  . Arthritis   . Basal cell carcinoma 02/06/2014   Skin of chest  . 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 Hypothyroidism   . Osteoporosis   . Personal history of radiation therapy   . PONV (postoperative nausea and vomiting)   . Wears glasses     Past Surgical History:  Procedure Laterality Date  . ABDOMINAL HYSTERECTOMY  1998  . APPENDECTOMY  2002   lap  . BREAST LUMPECTOMY WITH NEEDLE LOCALIZATION Right 12/25/2012   Procedure: BREAST LUMPECTOMY WITH NEEDLE LOCALIZATION;  Surgeon: Edward Jolly, MD;  Location: [facility];  Service: General;  Laterality: Right;  . COLONOSCOPY    . COLONOSCOPY WITH PROPOFOL N/A 02/29/2016   Procedure: COLONOSCOPY WITH PROPOFOL;  Surgeon: Garlan Fair, MD;  Location: [facility];  Service: Endoscopy;  Laterality: N/A;  . OPEN REDUCTION INTERNAL FIXATION (ORIF) DISTAL RADIAL FRACTURE Right 04/2016   [facility]  . TONSILLECTOMY      Family History  Problem Relation Age of Onset  . COPD Mother     Social History   Socioeconomic History  . Marital status: Married    Spouse name: Not on file  . Number of  children: Not on file  . Years of education: Not on file  . Highest education level: Not on file  Occupational History  . Not on file  Social Needs  . Financial resource strain: Not on file  . Food insecurity:    Worry: Not on file    Inability: Not on file  . Transportation needs:    Medical: Not on file    Non-medical: Not on file  Tobacco Use  . Smoking status: Never Smoker  . Smokeless tobacco: Never Used  Substance and Sexual Activity  . Alcohol use: No    Alcohol/week: 0.0 oz  . Drug use: No  . Sexual activity: Not Currently    Partners: Male    Birth control/protection: Post-menopausal  Lifestyle  . Physical activity:    Days per week: Not on file    Minutes per session: Not on file  . Stress: Not on file  Relationships  . Social connections:    Talks on phone: Not on file    Gets together: Not on file    Attends religious service: Not on file    Active member of club or organization: Not on file    Attends meetings of clubs or organizations: Not on file    Relationship status: Not on file  . Intimate partner violence:    Fear of current or ex partner: Not on  file    Emotionally abused: Not on file    Physically abused: Not on file    Forced sexual activity: Not on file  Other Topics Concern  . Not on file  Social History Narrative  . Not on file    Outpatient Medications Prior to Visit  Medication Sig Dispense Refill  . Calcium Carb-Cholecalciferol (CALCIUM-VITAMIN D) 500-200 MG-UNIT tablet Take by mouth.    . calcium carbonate (OS-CAL) 600 MG TABS tablet Take 600 mg by mouth 2 (two) times daily with a meal.    . glucosamine-chondroitin 500-400 MG tablet Take by mouth.    Marland Kitchen GLUCOSAMINE-CHONDROITIN PO Take 1 tablet by mouth daily.    . Multiple Vitamin (MULTI-VITAMINS) TABS Take by mouth.    . Multiple Vitamins-Minerals (MULTIVITAMIN WITH MINERALS) tablet Take 1 tablet by mouth daily.    Marland Kitchen SYNTHROID 75 MCG tablet TAKE 1 TABLET BY MOUTH ONCE DAILY 90  tablet 1  . predniSONE (DELTASONE) 10 MG tablet Take 3 tabs on days 1-2, take 2 tabs on days 3-4, take 1 tab on days 5-6 (Patient not taking: Reported on 08/15/2017) 12 tablet 0   No facility-administered medications prior to visit.     ROS:  Review of Systems  Constitutional: Negative for fever.  Gastrointestinal: Negative for blood in stool, constipation, diarrhea, nausea and vomiting.  Genitourinary: Positive for vaginal discharge. Negative for dyspareunia, dysuria, flank pain, frequency, hematuria, urgency, vaginal bleeding and vaginal pain.  Musculoskeletal: Negative for back pain.  Skin: Negative for rash.    OBJECTIVE:   Vitals:  BP 122/74   Pulse 73   Ht 5\' 6"  (1.676 m)   Wt 123 lb (55.8 kg)   BMI 19.85 kg/m   Physical Exam  Constitutional: She is oriented to person, place, and time. Vital signs are normal. She appears well-developed.  Pulmonary/Chest: Effort normal.  Genitourinary: Vagina normal and uterus normal. There is no rash, tenderness or lesion on the right labia. There is no rash, tenderness or lesion on the left labia. Uterus is not enlarged and not tender. Cervix exhibits no motion tenderness. Right adnexum displays no mass and no tenderness. Left adnexum displays no mass and no tenderness. No erythema or tenderness in the vagina. No vaginal discharge found.  Genitourinary Comments: UTERUS/CX SURG ABSENT  Musculoskeletal: Normal range of motion.  Neurological: She is alert and oriented to person, place, and time.  Psychiatric: She has a normal mood and affect. Her behavior is normal. Thought content normal.  Vitals reviewed.   Results: Results for orders placed or performed in visit on 08/15/17 (from the past 24 hour(s))  POCT Wet Prep with KOH     Status: Normal   Collection Time: 08/15/17 10:56 AM  Result Value Ref Range   Trichomonas, UA Negative    Clue Cells Wet Prep HPF POC neg    Epithelial Wet Prep HPF POC  Few, Moderate, Many, Too numerous to  count   Yeast Wet Prep HPF POC neg    Bacteria Wet Prep HPF POC  Few   RBC Wet Prep HPF POC     WBC Wet Prep HPF POC     KOH Prep POC Negative Negative    Assessment/Plan: Acute vaginitis - Sx resolved after monistat tx. Neg exam/wet prep. Reassurance. F/u prn.  - Plan: POCT Wet Prep with KOH    Return if symptoms worsen or fail to improve.  Alicia B. Copland, PA-C 08/15/2017 10:57 AM

## 2017-08-15 NOTE — Patient Instructions (Signed)
I value your feedback and entrusting us with your care. If you get a [facility] patient survey, I would appreciate you taking the time to let us know about your experience today. Thank you! 

## 2017-09-14 ENCOUNTER — Telehealth: Payer: Self-pay | Admitting: Family Medicine

## 2017-09-14 MED ORDER — SYNTHROID 75 MCG PO TABS: 75.0000 ug | ORAL_TABLET | Freq: Every day | ORAL | 0 refills | Status: DC

## 2017-09-14 NOTE — Telephone Encounter (Signed)
Copied from [facility] [phone]. Topic: Quick Communication - Rx Refill/Question >> Sep 14, 2017  9:48 AM Scherrie Gerlach wrote: Medication: SYNTHROID 75 MCG tablet  Has the patient contacted their pharmacy? Yes, but we did not get  [place], [place] - [place] [phone] (Phone) [phone] (Fax)

## 2017-11-22 ENCOUNTER — Encounter: Payer: Self-pay | Admitting: Family Medicine

## 2017-12-13 ENCOUNTER — Encounter: Payer: Self-pay | Admitting: Obstetrics and Gynecology

## 2017-12-13 ENCOUNTER — Ambulatory Visit
Admission: RE | Admit: 2017-12-13 | Discharge: 2017-12-13 | Disposition: A | Discharge status: Home / Self Care | Payer: 59 | Source: Ambulatory Visit | Attending: Obstetrics and Gynecology | Admitting: Obstetrics and Gynecology

## 2017-12-13 DIAGNOSIS — Z1239 Encounter for other screening for malignant neoplasm of breast: Secondary | ICD-10-CM

## 2017-12-13 DIAGNOSIS — Z853 Personal history of malignant neoplasm of breast: Secondary | ICD-10-CM

## 2018-01-11 ENCOUNTER — Other Ambulatory Visit: Payer: Self-pay | Admitting: Family Medicine

## 2018-01-11 NOTE — Telephone Encounter (Signed)
Requested medication (s) are due for refill today:   Requested medication (s) are on the active medication list: yes    Last refill: 07/30/17 12 0 refills  Future visit scheduled yes 01/15/18  Dr. Caryl Bis  Notes to clinic:not delegated  Requested Prescriptions  Pending Prescriptions Disp Refills   predniSONE (DELTASONE) 10 MG tablet 12 tablet 0    Sig: Take 3 tabs on days 1-2, take 2 tabs on days 3-4, take 1 tab on days 5-6     Not Delegated - Endocrinology:  Oral Corticosteroids Failed - 01/11/2018  5:25 PM      Failed - This refill cannot be delegated      Failed - Valid encounter within last 6 months    Recent Outpatient Visits          1 year ago Routine general medical examination at a health care facility   [facility] [name], MD   1 year ago Right forearm pain   [facility], [name], DO   1 year ago Injury of right forearm, initial encounter   [place] [name], MD   1 year ago Hypothyroidism, unspecified type   [facility] [name], MD   2 years ago Encounter for general adult medical examination with abnormal findings   [facility] Caryl Bis, Angela Adam, MD      Future Appointments            In 4 days [name], MD [facility], [place] BP in normal range    BP Readings from Last 1 Encounters:  08/15/17 122/74

## 2018-01-11 NOTE — Telephone Encounter (Signed)
Copied from [place] [phone] [id number]. Topic: Quick Communication - Rx Refill/Question >> Jan 11, 2018  4:35 PM Mcneil, Ja-Kwan wrote: Medication: SYNTHROID 75 MCG tablet  Has the patient contacted their pharmacy? yes   Preferred Pharmacy (with phone number or street name): [facility] [address] [place] - [place] [phone] (Phone)  [phone] (Fax)  Agent: Please be advised that RX refills may take up to 3 business days. We ask that you follow-up with your pharmacy.

## 2018-01-15 ENCOUNTER — Encounter: Payer: Self-pay | Admitting: Family Medicine

## 2018-01-15 ENCOUNTER — Ambulatory Visit (INDEPENDENT_AMBULATORY_CARE_PROVIDER_SITE_OTHER): Payer: Medicare Other | Admitting: Family Medicine

## 2018-01-15 VITALS — BP 118/76 | HR 78 | Temp 97.9°F | Ht 65.0 in | Wt 120.4 lb

## 2018-01-15 DIAGNOSIS — S61210A Laceration without foreign body of right index finger without damage to nail, initial encounter: Secondary | ICD-10-CM | POA: Insufficient documentation

## 2018-01-15 DIAGNOSIS — Z13220 Encounter for screening for lipoid disorders: Secondary | ICD-10-CM | POA: Diagnosis not present

## 2018-01-15 DIAGNOSIS — Z130 Encounter for screening for diseases of the blood and blood-forming organs and certain disorders involving the immune mechanism: Secondary | ICD-10-CM

## 2018-01-15 DIAGNOSIS — Z23 Encounter for immunization: Secondary | ICD-10-CM | POA: Diagnosis not present

## 2018-01-15 DIAGNOSIS — R7309 Other abnormal glucose: Secondary | ICD-10-CM

## 2018-01-15 DIAGNOSIS — E039 Hypothyroidism, unspecified: Secondary | ICD-10-CM

## 2018-01-15 MED ORDER — SYNTHROID 75 MCG PO TABS: 75.0000 ug | ORAL_TABLET | Freq: Every day | ORAL | 0 refills | Status: DC

## 2018-01-15 NOTE — Telephone Encounter (Signed)
Please find out what she needs this for. She did not bring this medication up at her visit today.

## 2018-01-15 NOTE — Assessment & Plan Note (Addendum)
Laceration appears to be well-healing with no signs of infection.  This does not involve the nail bed.  There is no apparent foreign body.  She will cleanse with soap and water.  She will monitor for infection.  If she develops signs of infection she will be evaluated.  Tetanus vaccination updated.

## 2018-01-15 NOTE — Progress Notes (Signed)
Tommi Rumps, MD Phone: [phone]  Ana Craig is a 67 y.o. female who presents today for follow-up.  CC: Hypothyroidism, finger laceration, elevated glucose  Hypothyroidism: Patient continues on Synthroid.  She requests a refill.  Finger laceration: Patient notes 2 days ago she was doing embroidery when the needle caught the tip of her index finger.  There is no nailbed injury.  The needle did not break off.  She has cleaned it with soap and water and used triple antibiotic ointment.  She has kept it covered with a bandage.  Elevated glucose: She is due for A1c recheck  Social History   Tobacco Use  Smoking Status Never Smoker  Smokeless Tobacco Never Used     ROS   General:  Negative for nexplained weight loss, fever Skin: Negative for new or changing mole, sore that won't heal HEENT: Negative for trouble hearing, trouble seeing, ringing in ears, mouth sores, hoarseness, change in voice, dysphagia. CV:  Negative for chest pain, dyspnea, edema, palpitations Resp: Negative for cough, dyspnea, hemoptysis GI: Negative for nausea, vomiting, diarrhea, constipation, abdominal pain, melena, hematochezia. GU: Negative for dysuria, incontinence, urinary hesitance, hematuria, vaginal or penile discharge, polyuria, sexual difficulty, lumps in testicle or breasts MSK: Negative for muscle cramps or aches, joint pain or swelling Neuro: Negative for headaches, weakness, numbness, dizziness, passing out/fainting Psych: Negative for depression, anxiety, memory problems   Objective  Physical Exam Vitals:   01/15/18 1540  BP: 118/76  Pulse: 78  Temp: 97.9 F (36.6 C)  SpO2: 98%    BP Readings from Last 3 Encounters:  01/15/18 118/76  08/15/17 122/74  07/30/17 112/76   Wt Readings from Last 3 Encounters:  01/15/18 120 lb 6.4 oz (54.6 kg)  08/15/17 123 lb (55.8 kg)  07/30/17 124 lb (56.2 kg)    Physical Exam  Constitutional: No distress.  HENT:  Head: Normocephalic and  atraumatic.  Mouth/Throat: Oropharynx is clear and moist.  Eyes: Pupils are equal, round, and reactive to light. Conjunctivae are normal.  Neck: Neck supple.  Cardiovascular: Normal rate, regular rhythm and normal heart sounds.  Pulmonary/Chest: Effort normal and breath sounds normal.  Abdominal: Soft. Bowel sounds are normal. She exhibits no distension. There is no tenderness. There is no rebound and no guarding.  Musculoskeletal: She exhibits no edema.  Radial aspect tip of right index finger with about a half a centimeter well-healing laceration with no signs of infection, this does not involve the nailbed, it did clip part of the grown fingernail, there is no tenderness, there is no visible underlying tissue, there is no apparent foreign body, the finger is warm and well-perfused  Lymphadenopathy:    She has no cervical adenopathy.  Neurological: She is alert.  Skin: Skin is warm and dry. She is not diaphoretic.  Psychiatric: She has a normal mood and affect.     Assessment/Plan: Please see individual problem list.  Laceration of right index finger without foreign body without damage to nail Laceration appears to be well-healing with no signs of infection.  This does not involve the nail bed.  There is no apparent foreign body.  She will cleanse with soap and water.  She will monitor for infection.  If she develops signs of infection she will be evaluated.  Tetanus vaccination updated.  Elevated glucose Check A1c.  Hypothyroidism Check TSH.  Continue Synthroid.   Health Maintenance: Flu vaccination given.  Patient will check with the pharmacy regarding Shingrix.  Orders Placed This Encounter  Procedures  .  Td : Tetanus/diphtheria >7yo Preservative  free  . Flu Vaccine QUAD 6+ mos PF IM (Fluarix Quad PF)  . Lipid panel  . HgB A1c  . Comp Met (CMET)  . TSH    Meds ordered this encounter  Medications  . SYNTHROID 75 MCG tablet    Sig: Take 1 tablet (75 mcg total) by mouth  daily.    Dispense:  90 tablet    Refill:  0     Tommi Rumps, MD [place]

## 2018-01-15 NOTE — Assessment & Plan Note (Signed)
Check TSH.  Continue Synthroid. 

## 2018-01-15 NOTE — Patient Instructions (Signed)
Nice to see you. We will check lab work today and contact you with the results. Please check with your pharmacy regarding Shingrix for shingles.

## 2018-01-15 NOTE — Telephone Encounter (Signed)
Please advise on refill.

## 2018-01-15 NOTE — Assessment & Plan Note (Signed)
Check A1c. 

## 2018-01-16 LAB — LIPID PANEL
CHOLESTEROL: 182 mg/dL (ref 0–200)
HDL: 68.7 mg/dL (ref >39.00)
LDL Cholesterol: 96 mg/dL (ref 0–99)
NonHDL: 113.57
TRIGLYCERIDES: 88 mg/dL (ref 0.0–149.0)
Total CHOL/HDL Ratio: 3
VLDL: 17.6 mg/dL (ref 0.0–40.0)

## 2018-01-16 LAB — COMPREHENSIVE METABOLIC PANEL
ALBUMIN: 4.6 g/dL (ref 3.5–5.2)
ALT: 16 U/L (ref 0–35)
AST: 22 U/L (ref 0–37)
Alkaline Phosphatase: 50 U/L (ref 39–117)
BILIRUBIN TOTAL: 1 mg/dL (ref 0.2–1.2)
BUN: 14 mg/dL (ref 6–23)
CHLORIDE: 102 meq/L (ref 96–112)
CO2: 29 meq/L (ref 19–32)
Calcium: 9.9 mg/dL (ref 8.4–10.5)
Creatinine, Ser: 0.84 mg/dL (ref 0.40–1.20)
GFR: 72.33 mL/min (ref >60.00)
Glucose, Bld: 87 mg/dL (ref 70–99)
POTASSIUM: 4.4 meq/L (ref 3.5–5.1)
Sodium: 139 mEq/L (ref 135–145)
TOTAL PROTEIN: 7.6 g/dL (ref 6.0–8.3)

## 2018-01-16 LAB — HEMOGLOBIN A1C: HEMOGLOBIN A1C: 5.5 % (ref 4.6–6.5)

## 2018-01-16 LAB — TSH: TSH: 1.68 u[IU]/mL (ref 0.35–4.50)

## 2018-01-17 NOTE — Telephone Encounter (Signed)
LMTCB

## 2018-01-17 NOTE — Telephone Encounter (Signed)
Patient unsure of why this was requested. She did not need refilled.

## 2018-01-21 ENCOUNTER — Other Ambulatory Visit: Payer: Self-pay

## 2018-01-21 MED ORDER — SYNTHROID 75 MCG PO TABS: 75.0000 ug | ORAL_TABLET | Freq: Every day | ORAL | 0 refills | Status: DC

## 2018-01-27 NOTE — Progress Notes (Signed)
PCP: [name], MD   Chief Complaint  Patient presents with  . Gynecologic Exam    HPI:      [name] is a 67 y.o. No obstetric history on file. who LMP was No LMP recorded. Patient has had a hysterectomy., presents today for her annual examination.  Her menses are absent due to menopause/hyst. She does not have intermenstrual bleeding.  She does not have vasomotor sx.   Sex activity: not sexually active. She does not have vaginal dryness.  Last Pap: 01/24/16  Results were: no abnormalities /neg HPV DNA.  Hx of STDs: none  Last mammogram: 12/13/17  Results were: normal--routine follow-up in 12 months There is no FH of breast cancer. There is no FH of ovarian cancer. The patient does do self-breast exams. Pt is s/p RT breast stage 0 breast cancer 2014 with lumpectomy and radiation. Pt declined tamoxifen tx. Just doing yearly mammos now.  Colonoscopy: colonoscopy 2 years ago without abnormalities.  Repeat due after 10 years.   Tobacco use: The patient denies current or previous tobacco use. Alcohol use: none Exercise: very active--joined [employer], working out regularly, eating healthy  She does get adequate calcium and Vitamin D in her diet.  Labs with PCP.   Past Medical History:  Diagnosis Date  . Arthritis   . Basal cell carcinoma 02/06/2014   Skin of chest  . 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 Hypothyroidism   . Osteoporosis   . Personal history of radiation therapy 2014   Right Breast Cancer  . PONV (postoperative nausea and vomiting)   . Wears glasses     Past Surgical History:  Procedure Laterality Date  . ABDOMINAL HYSTERECTOMY  1998  . APPENDECTOMY  2002   lap  . BREAST LUMPECTOMY Right 2014  . BREAST LUMPECTOMY WITH NEEDLE LOCALIZATION Right 12/25/2012   Procedure: BREAST LUMPECTOMY WITH NEEDLE LOCALIZATION;  Surgeon: Edward Jolly, MD;  Location: [place];   Service: General;  Laterality: Right;  . COLONOSCOPY    . COLONOSCOPY WITH PROPOFOL N/A 02/29/2016   Procedure: COLONOSCOPY WITH PROPOFOL;  Surgeon: Garlan Fair, MD;  Location: [facility];  Service: Endoscopy;  Laterality: N/A;  . OPEN REDUCTION INTERNAL FIXATION (ORIF) DISTAL RADIAL FRACTURE Right 04/2016   [place]  . TONSILLECTOMY      Family History  Problem Relation Age of Onset  . COPD Mother     Social History   Socioeconomic History  . Marital status: Married    Spouse name: Not on file  . Number of children: Not on file  . Years of education: Not on file  . Highest education level: Not on file  Occupational History  . Not on file  Social Needs  . Financial resource strain: Not on file  . Food insecurity:    Worry: Not on file    Inability: Not on file  . Transportation needs:    Medical: Not on file    Non-medical: Not on file  Tobacco Use  . Smoking status: Never Smoker  . Smokeless tobacco: Never Used  Substance and Sexual Activity  . Alcohol use: No    Alcohol/week: 0.0 standard drinks  . Drug use: No  . Sexual activity: Not Currently    Partners: Male    Birth control/protection: Post-menopausal  Lifestyle  . Physical activity:    Days per week: Not on file    Minutes  per session: Not on file  . Stress: Not on file  Relationships  . Social connections:    Talks on phone: Not on file    Gets together: Not on file    Attends religious service: Not on file    Active member of club or organization: Not on file    Attends meetings of clubs or organizations: Not on file    Relationship status: Not on file  . Intimate partner violence:    Fear of current or ex partner: Not on file    Emotionally abused: Not on file    Physically abused: Not on file    Forced sexual activity: Not on file  Other Topics Concern  . Not on file  Social History Narrative  . Not on file    Current Meds  Medication Sig  . Calcium Carb-Cholecalciferol  (CALCIUM-VITAMIN D) 500-200 MG-UNIT tablet Take by mouth.  Marland Kitchen glucosamine-chondroitin 500-400 MG tablet Take by mouth.  . Multiple Vitamins-Minerals (MULTIVITAMIN WITH MINERALS) tablet Take 1 tablet by mouth daily.  Marland Kitchen SYNTHROID 75 MCG tablet Take 1 tablet (75 mcg total) by mouth daily.      ROS:  Review of Systems  Constitutional: Negative for fatigue, fever and unexpected weight change.  Respiratory: Negative for cough, shortness of breath and wheezing.   Cardiovascular: Negative for chest pain, palpitations and leg swelling.  Gastrointestinal: Negative for blood in stool, constipation, diarrhea, nausea and vomiting.  Endocrine: Negative for cold intolerance, heat intolerance and polyuria.  Genitourinary: Negative for dyspareunia, dysuria, flank pain, frequency, genital sores, hematuria, menstrual problem, pelvic pain, urgency, vaginal bleeding, vaginal discharge and vaginal pain.  Musculoskeletal: Negative for back pain, joint swelling and myalgias.  Skin: Negative for rash.  Neurological: Negative for dizziness, syncope, light-headedness, numbness and headaches.  Hematological: Negative for adenopathy.  Psychiatric/Behavioral: Negative for agitation, confusion, sleep disturbance and suicidal ideas. The patient is not nervous/anxious.      Objective: BP 104/60   Pulse 81   Ht 5\' 5"  (1.651 m)   Wt 123 lb (55.8 kg)   BMI 20.47 kg/m    Physical Exam Constitutional:      Appearance: She is well-developed.  Genitourinary:     Vagina normal.     No vaginal discharge, erythema or tenderness.     No right or left adnexal mass present.     Right adnexa not tender.     Left adnexa not tender.     Genitourinary Comments: UTERUS/CX SURG REM  Neck:     Musculoskeletal: Normal range of motion.     Thyroid: No thyromegaly.  Cardiovascular:     Rate and Rhythm: Normal rate and regular rhythm.     Heart sounds: Normal heart sounds. No murmur.  Pulmonary:     Effort: Pulmonary  effort is normal.     Breath sounds: Normal breath sounds.  Chest:     Breasts:        Right: No mass, nipple discharge, skin change or tenderness.        Left: No mass, nipple discharge, skin change or tenderness.  Abdominal:     Palpations: Abdomen is soft.     Tenderness: There is no abdominal tenderness. There is no guarding.  Musculoskeletal: Normal range of motion.  Neurological:     Mental Status: She is alert and oriented to person, place, and time.     Cranial Nerves: No cranial nerve deficit.  Psychiatric:        Behavior: Behavior normal.  Vitals signs reviewed.     Assessment/Plan:  Encounter for annual routine gynecological examination  Screening for breast cancer - Pt current on mammo.   History of breast cancer - Followed by yearly mammos.         GYN counsel breast self exam, mammography screening, menopause, adequate intake of calcium and vitamin D, diet and exercise    F/U  Return in about 2 years (around 09/81/1914) for annual.  Alicia B. Copland, PA-C 01/28/2018 9:01 AM

## 2018-01-28 ENCOUNTER — Encounter: Payer: Self-pay | Admitting: Obstetrics and Gynecology

## 2018-01-28 ENCOUNTER — Ambulatory Visit (INDEPENDENT_AMBULATORY_CARE_PROVIDER_SITE_OTHER): Payer: Medicare Other | Admitting: Obstetrics and Gynecology

## 2018-01-28 VITALS — BP 104/60 | HR 81 | Ht 65.0 in | Wt 123.0 lb

## 2018-01-28 DIAGNOSIS — Z85828 Personal history of other malignant neoplasm of skin: Secondary | ICD-10-CM

## 2018-01-28 DIAGNOSIS — Z1239 Encounter for other screening for malignant neoplasm of breast: Secondary | ICD-10-CM

## 2018-01-28 DIAGNOSIS — Z01419 Encounter for gynecological examination (general) (routine) without abnormal findings: Secondary | ICD-10-CM

## 2018-01-28 DIAGNOSIS — Z853 Personal history of malignant neoplasm of breast: Secondary | ICD-10-CM | POA: Diagnosis not present

## 2018-01-28 NOTE — Patient Instructions (Signed)
I value your feedback and entrusting us with your care. If you get a [facility] patient survey, I would appreciate you taking the time to let us know about your experience today. Thank you! 

## 2018-07-15 ENCOUNTER — Other Ambulatory Visit: Payer: Self-pay | Admitting: Family Medicine

## 2018-09-24 ENCOUNTER — Encounter: Payer: Self-pay | Admitting: Family Medicine

## 2018-09-24 DIAGNOSIS — Z0110 Encounter for examination of ears and hearing without abnormal findings: Secondary | ICD-10-CM

## 2018-10-09 ENCOUNTER — Other Ambulatory Visit: Payer: Self-pay | Admitting: Family Medicine

## 2018-10-30 ENCOUNTER — Other Ambulatory Visit: Payer: Self-pay | Admitting: Family Medicine

## 2018-10-30 DIAGNOSIS — Z1231 Encounter for screening mammogram for malignant neoplasm of breast: Secondary | ICD-10-CM

## 2018-10-31 ENCOUNTER — Telehealth: Payer: Self-pay | Admitting: Family Medicine

## 2018-10-31 NOTE — Telephone Encounter (Signed)
Pt sent a Mychart message. Pt wants to would like to have labs done before Physical that's scheduled on 11/20/2018. Order needed please and thank you!

## 2018-11-02 NOTE — Telephone Encounter (Signed)
Based on when her last lab work was completed she will not be due for any lab work at the time of her physical.  Her labs were completed in December 2019 and her insurance may not pay for some of them to be repeated sooner than 1 year after the prior ones were completed.

## 2018-11-05 NOTE — Telephone Encounter (Signed)
I called and scheduled a lab and physical for the same day 01/21/2019 per patient request.  Nina,cma

## 2018-11-20 ENCOUNTER — Encounter: Payer: Medicare Other | Admitting: Family Medicine

## 2018-12-16 ENCOUNTER — Other Ambulatory Visit: Payer: Self-pay

## 2018-12-16 ENCOUNTER — Ambulatory Visit
Admission: RE | Admit: 2018-12-16 | Discharge: 2018-12-16 | Disposition: A | Discharge status: Home / Self Care | Payer: Medicare Other | Source: Ambulatory Visit | Attending: Family Medicine | Admitting: Family Medicine

## 2018-12-16 DIAGNOSIS — Z1231 Encounter for screening mammogram for malignant neoplasm of breast: Secondary | ICD-10-CM

## 2018-12-17 ENCOUNTER — Encounter: Payer: Self-pay | Admitting: Obstetrics and Gynecology

## 2018-12-18 ENCOUNTER — Ambulatory Visit: Payer: Self-pay

## 2018-12-18 MED ORDER — DOXYCYCLINE HYCLATE 100 MG PO TABS: 100.0000 mg | ORAL_TABLET | Freq: Two times a day (BID) | ORAL | 0 refills | Status: DC

## 2018-12-18 MED ORDER — METRONIDAZOLE 500 MG PO TABS: 500.0000 mg | ORAL_TABLET | Freq: Three times a day (TID) | ORAL | 0 refills | Status: DC

## 2018-12-18 NOTE — Telephone Encounter (Signed)
Patient confirmed with Animal Control cat had rabies vaccination on 10/16/2018 and that the cat only needs a fvrcp vaccination booster. Patient is scheduled for FU with NP 12/20/18 and will upload a picture of scratches via Mychart for visual. Advised patient develops fever , wounds become red , hot to touch or increased pain she will need earlier evaluation.

## 2018-12-18 NOTE — Telephone Encounter (Signed)
She needs to have this looked at in the ED given that the cat was feral. She will likely need a rabies vaccine and given it was a bite to the hands she will need to be on prophylactic antibiotics. The ED is the only place to get the rabies vaccine so I would advise that she go there. Please find out if this has been reported to animal control as well. Thanks.

## 2018-12-18 NOTE — Telephone Encounter (Signed)
Incoming call from Patient reporting that feral cat caused lacerations on both hands.  Patient was attempting to put cat in crate.  Cat didn't was to into crate. Multiple lacerations.  Patient reports  Tetanus  Shots is current.  Denies pregnancy.    Reviewed  Protocol with Patient.   Reccomened Pt.  Go to ED or Urgent care to be evaluated.  Pt states if site looks angry  Or gets worse.  She will go. Will let Dr.  Caryl Bis kno.

## 2018-12-18 NOTE — Telephone Encounter (Signed)
Noted. RN noted the health department advised prophylactic antibiotics. Patient has a penicillin allergy. Regimen of doxycycline and flagyl confirmed on uptodate. Sent to pharmacy. Patient needs to keep appointment with NP on Friday. Thanks.

## 2018-12-18 NOTE — Telephone Encounter (Signed)
   Reason for Disposition . [1] Puncture wound (hole through the skin) AND [2] from a cat bite (or deep claw puncture wound)  Answer Assessment - Initial Assessment Questions 1. ANIMAL: "What type of animal caused the bite?" "Is the injury from a bite or a claw?" If the animal is a dog or a cat, ask: "Was it a pet or a stray?" "Was it acting ill or behaving strangely?"     *No Answer*baby putting in crate 2. LOCATION: "Where is the bite located?"     Both hand 3. SIZE: "How big is the bite?" "What does it look like?"      mutiple 4. ONSET: "When did the bite happen?" (Minutes or hours ago)      This morning.   5. CIRCUMSTANCES: "Tell me how this happened."     Trying to put cat in crate.   6. TETANUS: "When was the last tetanus booster?"      December 7. PREGNANCY: "Is there any chance you are pregnant?" "When was your last menstrual period?"     na  Protocols used: ANIMAL BITE-A-AH

## 2018-12-18 NOTE — Addendum Note (Signed)
Addended by: [name] on: 12/18/2018 04:27 PM   Modules accepted: Orders

## 2018-12-20 ENCOUNTER — Encounter: Payer: Self-pay | Admitting: Family Medicine

## 2018-12-20 ENCOUNTER — Ambulatory Visit (INDEPENDENT_AMBULATORY_CARE_PROVIDER_SITE_OTHER): Payer: Medicare Other | Admitting: Family Medicine

## 2018-12-20 VITALS — Ht 65.0 in | Wt 123.0 lb

## 2018-12-20 DIAGNOSIS — S60811A Abrasion of right wrist, initial encounter: Secondary | ICD-10-CM | POA: Diagnosis not present

## 2018-12-20 DIAGNOSIS — S60812A Abrasion of left wrist, initial encounter: Secondary | ICD-10-CM

## 2018-12-20 DIAGNOSIS — W5503XA Scratched by cat, initial encounter: Secondary | ICD-10-CM | POA: Diagnosis not present

## 2018-12-20 NOTE — Progress Notes (Signed)
Patient ID: Ana Craig, female   DOB: 1950-07-07, 68 y.o.   MRN: [id number]    Virtual Visit via phone Note  This visit type was conducted due to national recommendations for restrictions regarding the COVID-19 pandemic (e.g. social distancing).  This format is felt to be most appropriate for this patient at this time.  All issues noted in this document were discussed and addressed.  No physical exam was performed (except for noted visual exam findings with Video Visits).   I connected with Ana Craig today at 10:00 AM EST by telephone and verified that I am speaking with the correct person using two identifiers. Location patient: home Location provider: work or home office Persons participating in the virtual visit: patient, provider  I discussed the limitations, risks, security and privacy concerns of performing an evaluation and management service by telephone and the availability of in person appointments. I also discussed with the patient that there may be a patient responsible charge related to this service. The patient expressed understanding and agreed to proceed.  HPI:  Patient and I connected via telephone to follow-up on cat scratches.  Patient was able to send photos over her MyChart, and I was able to view these photos today.  Patient has scratches on both wrists and hands from a cat she was fostering.  Cat is up-to-date on rabies vaccine, cat scratched her due to the cat having to go into the great and Becoming scared.  Patient is taking course of doxycycline and Flagyl and has been keeping close eye on skin, washing skin with soap and water and putting bacitracin ointment on scratch areas.  Patient states she feels the areas are improving and has no worsening redness, swelling, pain or any fevers.  Areas that cat scratched her are scabbed over and none are draining.    ROS: See pertinent positives and negatives per HPI.  Past Medical History:  Diagnosis Date  . Arthritis   . Basal  cell carcinoma 02/06/2014   Skin of chest  . 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 Hypothyroidism   . Osteoporosis   . Personal history of radiation therapy 2014   Right Breast Cancer  . PONV (postoperative nausea and vomiting)   . Wears glasses     Past Surgical History:  Procedure Laterality Date  . ABDOMINAL HYSTERECTOMY  1998  . APPENDECTOMY  2002   lap  . BREAST LUMPECTOMY Right 2014  . BREAST LUMPECTOMY WITH NEEDLE LOCALIZATION Right 12/25/2012   Procedure: BREAST LUMPECTOMY WITH NEEDLE LOCALIZATION;  Surgeon: Edward Jolly, MD;  Location: [facility];  Service: General;  Laterality: Right;  . COLONOSCOPY    . COLONOSCOPY WITH PROPOFOL N/A 02/29/2016   Procedure: COLONOSCOPY WITH PROPOFOL;  Surgeon: Garlan Fair, MD;  [facility];  Service: Endoscopy;  Laterality: N/A;  . OPEN REDUCTION INTERNAL FIXATION (ORIF) DISTAL RADIAL FRACTURE Right 04/2016   [place]  . TONSILLECTOMY      Family History  Problem Relation Age of Onset  . COPD Mother     Social History   Tobacco Use  . Smoking status: Never Smoker  . Smokeless tobacco: Never Used  Substance Use Topics  . Alcohol use: No    Alcohol/week: 0.0 standard drinks      Current Outpatient Medications:  .  Calcium Carb-Cholecalciferol (CALCIUM-VITAMIN D) 500-200 MG-UNIT tablet, Take by mouth., Disp: , Rfl:  .  doxycycline (VIBRA-TABS) 100 MG tablet,  Take 1 tablet (100 mg total) by mouth 2 (two) times daily., Disp: 10 tablet, Rfl: 0 .  glucosamine-chondroitin 500-400 MG tablet, Take by mouth., Disp: , Rfl:  .  metroNIDAZOLE (FLAGYL) 500 MG tablet, Take 1 tablet (500 mg total) by mouth 3 (three) times daily., Disp: 15 tablet, Rfl: 0 .  Multiple Vitamins-Minerals (MULTIVITAMIN WITH MINERALS) tablet, Take 1 tablet by mouth daily., Disp: , Rfl:  .  SYNTHROID 75 MCG tablet, TAKE 1 TABLET BY MOUTH ONCE DAILY (NEEDS  FOLLOW  UP  APPOINTMENT),  Disp: 90 tablet, Rfl: 0  EXAM:  GENERAL: alert, oriented, appears well and in no acute distress  HEENT: atraumatic, conjunttiva clear, no obvious abnormalities on inspection of external nose and ears  NECK: normal movements of the head and neck  LUNGS: on inspection no signs of respiratory distress, breathing rate appears normal, no obvious gross SOB, gasping or wheezing  CV: no obvious cyanosis  MS: moves all visible extremities without noticeable abnormality  SKIN: I was able to see the cat scratch areas over my chart message.  Small scratch areas seen on bilateral wrists and bilateral hands, appears to have scabbed over.  No obvious swelling or harsh redness of skin.  Scratch areas seem to be healing as we would hope and expect with antibiotics, good skin cleaning and topical ointment.  PSYCH/NEURO: pleasant and cooperative, no obvious depression or anxiety, speech and thought processing grossly intact  ASSESSMENT AND PLAN:  Discussed the following assessment and plan:  Cat scratch - Bilat wrists/hands Patient will finish doxycycline and Flagyl course as prescribed.  She will continue washing skin with mild soap and water and applying thin layer bacitracin ointment 1-2 times a day.  She will monitor for any worsening redness, pain, fevers, drainage and let us know if these occur right away.  Tetanus is up-to-date.    I discussed the assessment and treatment plan with the patient. The patient was provided an opportunity to ask questions and all were answered. The patient agreed with the plan and demonstrated an understanding of the instructions.   The patient was advised to call back or seek an in-person evaluation if the symptoms worsen or if the condition fails to improve as anticipated.  12 minutes spent over phone discussing plan of care with patient Jodelle Green, FNP

## 2018-12-30 ENCOUNTER — Encounter: Payer: Self-pay | Admitting: Obstetrics and Gynecology

## 2018-12-31 ENCOUNTER — Encounter: Payer: Self-pay | Admitting: Family Medicine

## 2018-12-31 ENCOUNTER — Telehealth: Payer: Self-pay

## 2018-12-31 NOTE — Telephone Encounter (Signed)
I called patient & stated that she would be willing to take Dr. Ellen Henri 3:15 appointment on December 7th which is first available. I told her when Dr. Caryl Bis returned we would see if we could fit her in any sooner than 12/7 & call her back.  She was advised to call if she started experiencing any new symptoms or saw anything else in her urine. For any severe symptoms I advised ED. Patient verbalized understanding.

## 2019-01-01 ENCOUNTER — Encounter: Payer: Self-pay | Admitting: Family Medicine

## 2019-01-06 ENCOUNTER — Ambulatory Visit (INDEPENDENT_AMBULATORY_CARE_PROVIDER_SITE_OTHER): Payer: Medicare Other | Admitting: Obstetrics and Gynecology

## 2019-01-06 ENCOUNTER — Other Ambulatory Visit: Payer: Self-pay

## 2019-01-06 ENCOUNTER — Encounter: Payer: Self-pay | Admitting: Obstetrics and Gynecology

## 2019-01-06 VITALS — BP 100/80 | Ht 66.0 in | Wt 125.0 lb

## 2019-01-06 DIAGNOSIS — R310 Gross hematuria: Secondary | ICD-10-CM

## 2019-01-06 LAB — POCT URINALYSIS DIPSTICK
Bilirubin, UA: NEGATIVE
Blood, UA: NEGATIVE
Glucose, UA: NEGATIVE
Ketones, UA: NEGATIVE
Nitrite, UA: NEGATIVE
Protein, UA: NEGATIVE
Spec Grav, UA: 1.01 (ref 1.010–1.025)
pH, UA: 7.5 (ref 5.0–8.0)

## 2019-01-06 NOTE — Progress Notes (Signed)
[name], MD   Chief Complaint  Patient presents with  . Urinary Tract Infection  . Vaginal Bleeding    HPI:      [name] is a 68 y.o. G0P0000 who LMP was No LMP recorded. Patient has had a hysterectomy., presents today for eval of a few drops of red blood on her pantyliner, twice in past 3 wks. No urin sx, no vag sx. She is s/p hyst, not sex active. No LBP, pelvic pain, fevers. No hx of kidney stones. No rectal bleeding. Has had recent diarrhea after abx use for cat scratch from PCP.  Pt noted strange object on toilet paper, looking like critter with egg sac, noticed after wiping recently. About 1 inch in size. On further discussion, most likely was in toilet paper with manufacturing because there is no way it passed through her urethra without pain/sx, and no way critter would be in bladder. (Picture in [place] email note from pt).  Pt with hx of breast cancer, completed tx.   Patient Active Problem List   Diagnosis Date Noted  . Laceration of right index finger without foreign body without damage to nail 01/15/2018  . Injury of right forearm 10/31/2016  . Closed right radial fracture - S/p ORIF - [facility]  05/02/2016  . Headache 12/06/2015  . 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 Hypothyroidism 07/17/2014  . Elevated glucose 07/17/2014  . History of breast cancer 12/16/2012    Past Surgical History:  Procedure Laterality Date  . ABDOMINAL HYSTERECTOMY  1998  . APPENDECTOMY  2002   lap  . BREAST LUMPECTOMY Right 2014  . BREAST LUMPECTOMY WITH NEEDLE LOCALIZATION Right 12/25/2012   Procedure: BREAST LUMPECTOMY WITH NEEDLE LOCALIZATION;  Surgeon: Edward Jolly, MD;  Location: [facility];  Service: General;  Laterality: Right;  . COLONOSCOPY    . COLONOSCOPY WITH PROPOFOL N/A 02/29/2016   Procedure: COLONOSCOPY WITH PROPOFOL;  Surgeon: Garlan Fair, MD;  Location: [facility];  Service: Endoscopy;  Laterality: N/A;  . OPEN REDUCTION  INTERNAL FIXATION (ORIF) DISTAL RADIAL FRACTURE Right 04/2016   [facility]  . TONSILLECTOMY      Family History  Problem Relation Age of Onset  . COPD Mother     Social History   Socioeconomic History  . Marital status: Married    Spouse name: Not on file  . Number of children: Not on file  . Years of education: Not on file  . Highest education level: Not on file  Occupational History  . Not on file  Social Needs  . Financial resource strain: Not on file  . Food insecurity    Worry: Not on file    Inability: Not on file  . Transportation needs    Medical: Not on file    Non-medical: Not on file  Tobacco Use  . Smoking status: Never Smoker  . Smokeless tobacco: Never Used  Substance and Sexual Activity  . Alcohol use: No    Alcohol/week: 0.0 standard drinks  . Drug use: No  . Sexual activity: Not Currently    Partners: Male    Birth control/protection: Post-menopausal  Lifestyle  . Physical activity    Days per week: Not on file    Minutes per session: Not on file  . Stress: Not on file  Relationships  . Social Herbalist on phone: Not on file    Gets together: Not on file    Attends religious service: Not  on file    Active member of club or organization: Not on file    Attends meetings of clubs or organizations: Not on file    Relationship status: Not on file  . Intimate partner violence    Fear of current or ex partner: Not on file    Emotionally abused: Not on file    Physically abused: Not on file    Forced sexual activity: Not on file  Other Topics Concern  . Not on file  Social History Narrative  . Not on file    Outpatient Medications Prior to Visit  Medication Sig Dispense Refill  . Calcium Carb-Cholecalciferol (CALCIUM-VITAMIN D) 500-200 MG-UNIT tablet Take by mouth.    Marland Kitchen glucosamine-chondroitin 500-400 MG tablet Take by mouth.    . Multiple Vitamins-Minerals (MULTIVITAMIN WITH MINERALS) tablet Take 1 tablet by mouth daily.    Marland Kitchen  SYNTHROID 75 MCG tablet TAKE 1 TABLET BY MOUTH ONCE DAILY (NEEDS  FOLLOW  UP  APPOINTMENT) 90 tablet 0  . doxycycline (VIBRA-TABS) 100 MG tablet Take 1 tablet (100 mg total) by mouth 2 (two) times daily. 10 tablet 0  . metroNIDAZOLE (FLAGYL) 500 MG tablet Take 1 tablet (500 mg total) by mouth 3 (three) times daily. 15 tablet 0   No facility-administered medications prior to visit.       ROS:  Review of Systems  Constitutional: Negative for fever.  Gastrointestinal: Positive for diarrhea. Negative for blood in stool, constipation, nausea and vomiting.  Genitourinary: Positive for vaginal bleeding. Negative for dyspareunia, dysuria, flank pain, frequency, hematuria, urgency, vaginal discharge and vaginal pain.  Musculoskeletal: Negative for back pain.  Skin: Negative for rash.   BREAST: No symptoms   OBJECTIVE:   Vitals:  BP 100/80   Ht 5\' 6"  (1.676 m)   Wt 125 lb (56.7 kg)   BMI 20.18 kg/m   Physical Exam Vitals signs reviewed.  Constitutional:      Appearance: She is well-developed.  Neck:     Musculoskeletal: Normal range of motion.  Pulmonary:     Effort: Pulmonary effort is normal.  Genitourinary:    General: Normal vulva.     Pubic Area: No rash.      Labia:        Right: No rash, tenderness or lesion.        Left: No rash, tenderness or lesion.      Vagina: Normal. No vaginal discharge, erythema or tenderness.     Uterus: Absent. Not enlarged and not tender.      Adnexa: Right adnexa normal and left adnexa normal.       Right: No mass or tenderness.         Left: No mass or tenderness.       Comments: NO EVID OF BLOOD/BLEEDING Musculoskeletal: Normal range of motion.  Skin:    General: Skin is warm and dry.  Neurological:     General: No focal deficit present.     Mental Status: She is alert and oriented to person, place, and time.  Psychiatric:        Mood and Affect: Mood normal.        Behavior: Behavior normal.        Thought Content: Thought  content normal.        Judgment: Judgment normal.     Results: Results for orders placed or performed in visit on 01/06/19 (from the past 24 hour(s))  POCT Urinalysis Dipstick     Status: Abnormal  Collection Time: 01/06/19  9:36 AM  Result Value Ref Range   Color, UA yellow    Clarity, UA clear    Glucose, UA Negative Negative   Bilirubin, UA neg    Ketones, UA neg    Spec Grav, UA 1.010 1.010 - 1.025   Blood, UA neg    pH, UA 7.5 5.0 - 8.0   Protein, UA Negative Negative   Urobilinogen, UA     Nitrite, UA neg    Leukocytes, UA Moderate (2+) (A) Negative   Appearance     Odor       Assessment/Plan: Gross hematuria - Plan: Urine Culture-GYN, POCT Urinalysis Dipstick; Neg UA.  Neg exam. S/p hyst. Check C&S. Will call with results. If neg, will follow sx. If recur, will refer to urology.     Return if symptoms worsen or fail to improve.  Alicia B. Copland, PA-C 01/06/2019 9:38 AM

## 2019-01-06 NOTE — Patient Instructions (Signed)
I value your feedback and entrusting us with your care. If you get a [facility] patient survey, I would appreciate you taking the time to let us know about your experience today. Thank you! 

## 2019-01-07 ENCOUNTER — Other Ambulatory Visit: Payer: Medicare Other

## 2019-01-08 LAB — URINE CULTURE: Organism ID, Bacteria: NO GROWTH

## 2019-01-13 ENCOUNTER — Ambulatory Visit: Payer: Medicare Other | Admitting: Family Medicine

## 2019-01-13 ENCOUNTER — Other Ambulatory Visit: Payer: Self-pay | Admitting: Family Medicine

## 2019-01-17 ENCOUNTER — Other Ambulatory Visit: Payer: Self-pay

## 2019-01-20 ENCOUNTER — Telehealth: Payer: Self-pay | Admitting: *Deleted

## 2019-01-20 DIAGNOSIS — Z13220 Encounter for screening for lipoid disorders: Secondary | ICD-10-CM

## 2019-01-20 DIAGNOSIS — E039 Hypothyroidism, unspecified: Secondary | ICD-10-CM

## 2019-01-20 NOTE — Addendum Note (Signed)
Addended by: [name] on: 01/20/2019 04:56 PM   Modules accepted: Orders

## 2019-01-20 NOTE — Telephone Encounter (Signed)
Please place future orders for lab appt.  

## 2019-01-20 NOTE — Telephone Encounter (Signed)
Orders placed.  It looks like somebody scheduled her for fasting labs without getting her scheduled for a follow-up visit.  She has not been seen by me for follow-up in a year.  Please get her scheduled.  Thanks.

## 2019-01-21 ENCOUNTER — Other Ambulatory Visit (INDEPENDENT_AMBULATORY_CARE_PROVIDER_SITE_OTHER): Payer: Medicare Other

## 2019-01-21 ENCOUNTER — Encounter: Payer: Medicare Other | Admitting: Family Medicine

## 2019-01-21 ENCOUNTER — Other Ambulatory Visit: Payer: Self-pay

## 2019-01-21 DIAGNOSIS — Z13220 Encounter for screening for lipoid disorders: Secondary | ICD-10-CM

## 2019-01-21 DIAGNOSIS — E039 Hypothyroidism, unspecified: Secondary | ICD-10-CM

## 2019-01-21 LAB — COMPREHENSIVE METABOLIC PANEL
ALT: 23 U/L (ref 0–35)
AST: 27 U/L (ref 0–37)
Albumin: 4.5 g/dL (ref 3.5–5.2)
Alkaline Phosphatase: 50 U/L (ref 39–117)
BUN: 13 mg/dL (ref 6–23)
CO2: 29 mEq/L (ref 19–32)
Calcium: 9.6 mg/dL (ref 8.4–10.5)
Chloride: 103 mEq/L (ref 96–112)
Creatinine, Ser: 0.95 mg/dL (ref 0.40–1.20)
GFR: 58.86 mL/min — ABNORMAL LOW (ref >60.00)
Glucose, Bld: 83 mg/dL (ref 70–99)
Potassium: 3.8 mEq/L (ref 3.5–5.1)
Sodium: 139 mEq/L (ref 135–145)
Total Bilirubin: 0.9 mg/dL (ref 0.2–1.2)
Total Protein: 7.6 g/dL (ref 6.0–8.3)

## 2019-01-21 LAB — LIPID PANEL
Cholesterol: 192 mg/dL (ref 0–200)
HDL: 85.3 mg/dL (ref >39.00)
LDL Cholesterol: 92 mg/dL (ref 0–99)
NonHDL: 106.61
Total CHOL/HDL Ratio: 2
Triglycerides: 73 mg/dL (ref 0.0–149.0)
VLDL: 14.6 mg/dL (ref 0.0–40.0)

## 2019-01-21 LAB — TSH: TSH: 4.41 u[IU]/mL (ref 0.35–4.50)

## 2019-01-21 NOTE — Telephone Encounter (Signed)
Called and left a voicemail informing the patient to call the office to schedule a follow up appt with the provider because she has not been seen in 1 year.  Renzo Vincelette,cma

## 2019-02-06 ENCOUNTER — Ambulatory Visit (INDEPENDENT_AMBULATORY_CARE_PROVIDER_SITE_OTHER): Payer: Medicare Other | Admitting: Family Medicine

## 2019-02-06 ENCOUNTER — Encounter: Payer: Self-pay | Admitting: Family Medicine

## 2019-02-06 ENCOUNTER — Other Ambulatory Visit: Payer: Self-pay

## 2019-02-06 VITALS — BP 110/72 | HR 80 | Temp 97.5°F | Resp 14 | Ht 66.0 in | Wt 123.8 lb

## 2019-02-06 DIAGNOSIS — E039 Hypothyroidism, unspecified: Secondary | ICD-10-CM | POA: Diagnosis not present

## 2019-02-06 DIAGNOSIS — Z13820 Encounter for screening for osteoporosis: Secondary | ICD-10-CM

## 2019-02-06 DIAGNOSIS — Z853 Personal history of malignant neoplasm of breast: Secondary | ICD-10-CM

## 2019-02-06 DIAGNOSIS — Z923 Personal history of irradiation: Secondary | ICD-10-CM | POA: Diagnosis not present

## 2019-02-06 MED ORDER — SYNTHROID 75 MCG PO TABS: ORAL_TABLET | ORAL | 3 refills | Status: DC

## 2019-02-06 NOTE — Progress Notes (Signed)
Name: Ana Craig   MRN: [id number]    DOB: 1950/06/29   Date:02/06/2019       Progress Note  Subjective  Chief Complaint  Chief Complaint  Patient presents with  . Establish Care    discuss labs  . Hypothyroidism    medication refill    HPI  PT presents to establish care and for the following:  Hypothyroid: She had recent labs and were WNL at 4.41.  We will maintain current dosing. She reports hypothyroidism without nodule history.  Denies palpitations, hair/skin/nail changes.   Hx breast cancer: Had radiation therapy on the right side that has caused some occasional right rib cage pain.  Diagnosed in 2014, she had lumpectomy by Dr. Excell Seltzer in 2014, completed radiation in 2015, and has not had any issues. goes annually for mammogram - now having screening mammograms annually (did 5 years of diagnostic).    Labs from 01/21/2019 were unremarkable - CMP, LDL, and TSH.  Mammogram UTD DEXA: Due for this - has not had this done in about 10 years per her report. PNA Vax: She notes that she has had 1 PNA vaccine in the past.   Patient Active Problem List   Diagnosis Date Noted  . Laceration of right index finger without foreign body without damage to nail 01/15/2018  . Injury of right forearm 10/31/2016  . Closed right radial fracture - S/p ORIF - [facility]  05/02/2016  . Headache 12/06/2015  . 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 Hypothyroidism 07/17/2014  . Elevated glucose 07/17/2014  . History of breast cancer 12/16/2012    Past Surgical History:  Procedure Laterality Date  . ABDOMINAL HYSTERECTOMY  1998  . APPENDECTOMY  2002   lap  . BREAST LUMPECTOMY Right 2014  . BREAST LUMPECTOMY WITH NEEDLE LOCALIZATION Right 12/25/2012   Procedure: BREAST LUMPECTOMY WITH NEEDLE LOCALIZATION;  Surgeon: Edward Jolly, MD;  Location: [facility];  Service: General;  Laterality: Right;  . COLONOSCOPY    . COLONOSCOPY WITH PROPOFOL N/A 02/29/2016   Procedure:  COLONOSCOPY WITH PROPOFOL;  Surgeon: Garlan Fair, MD;  Location: [facility];  Service: Endoscopy;  Laterality: N/A;  . OPEN REDUCTION INTERNAL FIXATION (ORIF) DISTAL RADIAL FRACTURE Right 04/2016   [facility]  . TONSILLECTOMY      Family History  Problem Relation Age of Onset  . COPD Mother     Social History   Socioeconomic History  . Marital status: Married    Spouse name: Not on file  . Number of children: Not on file  . Years of education: Not on file  . Highest education level: Not on file  Occupational History  . Not on file  Tobacco Use  . Smoking status: Never Smoker  . Smokeless tobacco: Never Used  Substance and Sexual Activity  . Alcohol use: No    Alcohol/week: 0.0 standard drinks  . Drug use: No  . Sexual activity: Not Currently    Partners: Male    Birth control/protection: Post-menopausal  Other Topics Concern  . Not on file  Social History Narrative  . Not on file   Social Determinants of Health   Financial Resource Strain:   . Difficulty of Paying Living Expenses: Not on file  Food Insecurity:   . Worried About Charity fundraiser in the Last Year: Not on file  . Ran Out of Food in the Last Year: Not on file  Transportation Needs:   . Lack of Transportation (Medical): Not on file  .  Lack of Transportation (Non-Medical): Not on file  Physical Activity:   . Days of Exercise per Week: Not on file  . Minutes of Exercise per Session: Not on file  Stress:   . Feeling of Stress : Not on file  Social Connections:   . Frequency of Communication with Friends and Family: Not on file  . Frequency of Social Gatherings with Friends and Family: Not on file  . Attends Religious Services: Not on file  . Active Member of Clubs or Organizations: Not on file  . Attends Archivist Meetings: Not on file  . Marital Status: Not on file  Intimate Partner Violence:   . Fear of Current or Ex-Partner: Not on file  . Emotionally Abused: Not on file  .  Physically Abused: Not on file  . Sexually Abused: Not on file     Current Outpatient Medications:  .  Calcium Carb-Cholecalciferol (CALCIUM-VITAMIN D) 500-200 MG-UNIT tablet, Take by mouth., Disp: , Rfl:  .  glucosamine-chondroitin 500-400 MG tablet, Take by mouth., Disp: , Rfl:  .  Multiple Vitamins-Minerals (MULTIVITAMIN WITH MINERALS) tablet, Take 1 tablet by mouth daily., Disp: , Rfl:  .  SYNTHROID 75 MCG tablet, TAKE 1 TABLET BY MOUTH ONCE DAILY -  NEED  FOLLOW  UP  APPT, Disp: 30 tablet, Rfl: 0  Allergies  Allergen Reactions  . Codeine Other (See Comments)  . Penicillins Rash    Has patient had a PCN reaction causing immediate rash, facial/tongue/throat swelling, SOB or lightheadedness with hypotension: {unknown Has patient had a PCN reaction causing severe rash involving mucus membranes or skin necrosis: {no Has patient had a PCN reaction that required hospitalization {no Has patient had a PCN reaction occurring within the last 10 years: {unknown If all of the above answers are "NO", then may proceed with Cephalosporin use.    I personally reviewed active problem list, medication list, allergies, notes from last encounter, lab results with the patient/caregiver today.   ROS  Constitutional: Negative for fever or weight change.  Respiratory: Negative for cough and shortness of breath.   Cardiovascular: Negative for chest pain or palpitations.  Gastrointestinal: Negative for abdominal pain, no bowel changes.  Musculoskeletal: Negative for gait problem or joint swelling.  Skin: Negative for rash.  Neurological: Negative for dizziness or headache.  No other specific complaints in a complete review of systems (except as listed in HPI above).  Objective  Vitals:   02/06/19 0925  BP: 110/72  Pulse: 80  Resp: 14  Temp: (!) 97.5 F (36.4 C)  TempSrc: Temporal  SpO2: 99%  Weight: 123 lb 12.8 oz (56.2 kg)  Height: 5\' 6"  (1.676 m)   Body mass index is 19.98  kg/m.  Physical Exam Constitutional: Patient appears well-developed and well-nourished. No distress.  HENT: Head: Normocephalic and atraumatic. Eyes: Conjunctivae and EOM are normal. No scleral icterus.   Neck: Normal range of motion. Neck supple. No JVD present. No thyromegaly present.  Cardiovascular: Normal rate, regular rhythm and normal heart sounds.  No murmur heard. No BLE edema. Pulmonary/Chest: Effort normal and breath sounds normal. No respiratory distress. Musculoskeletal: Normal range of motion, no joint effusions. No gross deformities Neurological: Pt is alert and oriented to person, place, and time. No cranial nerve deficit. Coordination, balance, strength, speech and gait are normal.  Skin: Skin is warm and dry. No rash noted. No erythema.  Psychiatric: Patient has a normal mood and affect. behavior is normal. Judgment and thought content normal.  No results  found for this or any previous visit (from the past 71 hour(s)).  PHQ2/9: Depression screen [facility] 2/9 02/06/2019 01/15/2018 10/31/2016 12/03/2014  Decreased Interest 0 0 0 0  Down, Depressed, Hopeless 0 0 0 0  PHQ - 2 Score 0 0 0 0  Altered sleeping 0 - - -  Tired, decreased energy 0 - - -  Change in appetite 0 - - -  Feeling bad or failure about yourself  0 - - -  Trouble concentrating 0 - - -  Moving slowly or fidgety/restless 0 - - -  Suicidal thoughts 0 - - -  PHQ-9 Score 0 - - -  Difficult doing work/chores Not difficult at all - - -   PHQ-2/9 Result is negative.    Fall Risk: Fall Risk  02/06/2019 12/20/2018 12/03/2014  Falls in the past year? 0 0 No  Number falls in past yr: 0 - -  Injury with Fall? 0 - -  Follow up Falls evaluation completed Falls evaluation completed -    Assessment & Plan  1. Hypothyroidism, unspecified type - SYNTHROID 75 MCG tablet; TAKE 1 TABLET BY MOUTH ONCE DAILY  Dispense: 90 tablet; Refill: 3 - [facility]; Future  2. Osteoporosis screening - [facility];  Future  3. Personal history of radiation therapy - [facility]; Future  4. History of breast cancer - Annual mammography

## 2019-02-13 NOTE — Progress Notes (Signed)
Lab results mailed to patient after 3 attempts of reaching the patient.  Zubair Lofton,cma

## 2019-02-21 ENCOUNTER — Encounter: Payer: Self-pay | Admitting: Family Medicine

## 2019-02-21 DIAGNOSIS — K219 Gastro-esophageal reflux disease without esophagitis: Secondary | ICD-10-CM | POA: Insufficient documentation

## 2019-02-21 DIAGNOSIS — M810 Age-related osteoporosis without current pathological fracture: Secondary | ICD-10-CM | POA: Insufficient documentation

## 2019-02-21 DIAGNOSIS — J309 Allergic rhinitis, unspecified: Secondary | ICD-10-CM | POA: Insufficient documentation

## 2019-04-02 ENCOUNTER — Telehealth: Payer: Self-pay | Admitting: Family Medicine

## 2019-04-02 NOTE — Telephone Encounter (Signed)
Copied from [place] [id number]. Topic: General - Other >> Apr 02, 2019 10:35 AM Yvette Rack wrote: Reason for CRM: Pt stated she was just returning the call to the office from a missed call that she had. Pt requests call back

## 2019-04-02 NOTE — Telephone Encounter (Signed)
My care guide, Reita Chard called to schedule an AWV with her and got in touch with her. Thank you!

## 2019-04-02 NOTE — Telephone Encounter (Signed)
Copied from [place] [id number]. Topic: General - Other >> Apr 02, 2019 10:35 AM Yvette Rack wrote: Reason for CRM: Pt stated she was just returning the call to the office from a missed call that she had. Pt requests call back

## 2019-04-03 ENCOUNTER — Ambulatory Visit (INDEPENDENT_AMBULATORY_CARE_PROVIDER_SITE_OTHER): Payer: Medicare Other

## 2019-04-03 VITALS — Ht 66.0 in | Wt 125.0 lb

## 2019-04-03 DIAGNOSIS — Z0000 Encounter for general adult medical examination without abnormal findings: Secondary | ICD-10-CM

## 2019-04-03 NOTE — Progress Notes (Signed)
Subjective:   Ana Craig is a 69 y.o. female who presents for an Initial Medicare Annual Wellness Visit.  Virtual Visit via Telephone Note  I connected with Alletta Calixto on 04/03/19 at  8:00 AM EST by telephone and verified that I am speaking with the correct person using two identifiers.  Medicare Annual Wellness visit completed telephonically due to Covid-19 pandemic.   Location: Patient: home Provider: office   I discussed the limitations, risks, security and privacy concerns of performing an evaluation and management service by telephone and the availability of in person appointments. The patient expressed understanding and agreed to proceed.  Some vital signs may be absent or patient reported.   Clemetine Marker, LPN    Review of Systems     Cardiac Risk Factors include: advanced age (>66men, >2 women)     Objective:    Today's Vitals   04/03/19 0816  Weight: 125 lb (56.7 kg)  Height: 5\' 6"  (1.676 m)   Body mass index is 20.18 kg/m.  Advanced Directives 04/03/2019 02/29/2016 02/28/2016 12/19/2012  Does Patient Have a Medical Advance Directive? Yes Yes Yes Patient has advance directive, copy not in chart  Type of Advance Directive [place];Living will [place];Living will - [place];Living will  Copy of [place] in Chart? Yes - validated most recent copy scanned in chart (See row information) Yes No - copy requested -    Current Medications (verified) Outpatient Encounter Medications as of 04/03/2019  Medication Sig  . Ascorbic Acid (VITAMIN C IMMUNE HEALTH PO) Take by mouth.  . Calcium Carb-Cholecalciferol (CALCIUM-VITAMIN D) 500-200 MG-UNIT tablet Take by mouth.  Marland Kitchen glucosamine-chondroitin 500-400 MG tablet Take by mouth.  . Multiple Vitamins-Minerals (MULTIVITAMIN WITH MINERALS) tablet Take 1 tablet by mouth daily.  Marland Kitchen SYNTHROID 75 MCG tablet TAKE 1 TABLET BY MOUTH ONCE DAILY   No  facility-administered encounter medications on file as of 04/03/2019.    Allergies (verified) Codeine and Penicillins   History: Past Medical History:  Diagnosis Date  . Arthritis   . Basal cell carcinoma 02/06/2014   Skin of chest  . 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 Hypothyroidism   . Osteoporosis   . Personal history of radiation therapy 2014   Right Breast Cancer  . PONV (postoperative nausea and vomiting)   . Wears glasses    Past Surgical History:  Procedure Laterality Date  . ABDOMINAL HYSTERECTOMY  1998  . APPENDECTOMY  2002   lap  . BREAST LUMPECTOMY Right 2014  . BREAST LUMPECTOMY WITH NEEDLE LOCALIZATION Right 12/25/2012   Procedure: BREAST LUMPECTOMY WITH NEEDLE LOCALIZATION;  Surgeon: Edward Jolly, MD;  Location: [facility];  Service: General;  Laterality: Right;  . COLONOSCOPY    . COLONOSCOPY WITH PROPOFOL N/A 02/29/2016   Procedure: COLONOSCOPY WITH PROPOFOL;  Surgeon: Garlan Fair, MD;  Location: [facility];  Service: Endoscopy;  Laterality: N/A;  . OPEN REDUCTION INTERNAL FIXATION (ORIF) DISTAL RADIAL FRACTURE Right 04/2016   [place]  . TONSILLECTOMY     Family History  Problem Relation Age of Onset  . COPD Mother    Social History   Socioeconomic History  . Marital status: Widowed    Spouse name: Not on file  . Number of children: 0  . Years of education: Not on file  . Highest education level: Not on file  Occupational History  . Not  on file  Tobacco Use  . Smoking status: Never Smoker  . Smokeless tobacco: Never Used  Substance and Sexual Activity  . Alcohol use: No    Alcohol/week: 0.0 standard drinks  . Drug use: No  . Sexual activity: Not Currently    Partners: Male    Birth control/protection: Post-menopausal  Other Topics Concern  . Not on file  Social History Narrative  . Not on file   Social Determinants of Health   Financial Resource Strain: Low  Risk   . Difficulty of Paying Living Expenses: Not hard at all  Food Insecurity: No Food Insecurity  . Worried About Charity fundraiser in the Last Year: Never true  . Ran Out of Food in the Last Year: Never true  Transportation Needs: No Transportation Needs  . Lack of Transportation (Medical): No  . Lack of Transportation (Non-Medical): No  Physical Activity: Sufficiently Active  . Days of Exercise per Week: 3 days  . Minutes of Exercise per Session: 90 min  Stress: No Stress Concern Present  . Feeling of Stress : Not at all  Social Connections: Somewhat Isolated  . Frequency of Communication with Friends and Family: More than three times a week  . Frequency of Social Gatherings with Friends and Family: Once a week  . Attends Religious Services: More than 4 times per year  . Active Member of Clubs or Organizations: No  . Attends Archivist Meetings: Never  . Marital Status: Widowed    Tobacco Counseling Counseling given: Not Answered   Clinical Intake:  Pre-visit preparation completed: Yes  Pain : No/denies pain     BMI - recorded: 20.18 Nutritional Status: BMI of 19-24  Normal Nutritional Risks: None Diabetes: No  How often do you need to have someone help you when you read instructions, pamphlets, or other written materials from your doctor or pharmacy?: 1 - Never  Interpreter Needed?: No  Information entered by :: Clemetine Marker LPN   Activities of Daily Living In your present state of health, do you have any difficulty performing the following activities: 04/03/2019 12/20/2018  Hearing? N N  Comment declines hearing aids -  Vision? N N  Difficulty concentrating or making decisions? N N  Walking or climbing stairs? N N  Dressing or bathing? N N  Doing errands, shopping? N N  Preparing Food and eating ? N -  Using the Toilet? N -  In the past six months, have you accidently leaked urine? N -  Do you have problems with loss of bowel control? N -    Managing your Medications? N -  Managing your Finances? N -  Housekeeping or managing your Housekeeping? N -  Some recent data might be hidden     Immunizations and Health Maintenance Immunization History  Administered Date(s) Administered  . Influenza,inj,Quad PF,6+ Mos 12/06/2015, 12/06/2016, 01/15/2018  . Td 01/15/2018  . Tdap 10/07/2008  . Zoster 11/05/2013   Health Maintenance Due  Topic Date Due  . DEXA SCAN  01/24/2018  . PNA vac Low Risk Adult (1 of 2 - PCV13) 01/24/2018    Patient Care Team: Hubbard Hartshorn, FNP as PCP - General (Family Medicine)  Indicate any recent Medical Services you may have received from other than [facility] providers in the past year (date may be approximate).     Assessment:   This is a routine wellness examination for Mairim.  Hearing/Vision screen  Hearing Screening   125Hz  250Hz  500Hz  1000Hz  2000Hz   3000Hz  4000Hz  6000Hz  8000Hz   Right ear:           Left ear:           Comments: Pt had baseline hearing test at [facility] in 2020. Pt has no difficulty hearing  Vision Screening Comments: [facility] screen[facility]  Dietary issues and exercise activities discussed: Current Exercise Habits: Home exercise routine, Type of exercise: treadmill;walking;strength training/weights;calisthenics, Time (Minutes): > 60, Frequency (Times/Week): 3, Weekly Exercise (Minutes/Week): 0, Intensity: Moderate, Exercise limited by: None identified  Goals   None    Depression Screen PHQ 2/9 Scores 04/03/2019 02/06/2019 01/15/2018 10/31/2016 12/03/2014  PHQ - 2 Score 0 0 0 0 0  PHQ- 9 Score - 0 - - -    Fall Risk Fall Risk  04/03/2019 02/06/2019 12/20/2018 12/03/2014  Falls in the past year? 0 0 0 No  Number falls in past yr: 0 0 - -  Injury with Fall? 0 0 - -  Risk for fall due to : No Fall Risks - - -  Follow up Falls prevention discussed Falls evaluation completed Falls evaluation completed -    FALL RISK PREVENTION PERTAINING  TO THE HOME:  Any stairs in or around the home? No  If so, do they handrails? No   Home free of loose throw rugs in walkways, pet beds, electrical cords, etc? Yes  Adequate lighting in your home to reduce risk of falls? Yes   ASSISTIVE DEVICES UTILIZED TO PREVENT FALLS:  Life alert? No  Use of a cane, walker or w/c? No  Grab bars in the bathroom? Yes  Shower chair or bench in shower? No  Elevated toilet seat or a handicapped toilet? No   DME ORDERS:  DME order needed?  No   TIMED UP AND GO:  Was the test performed? No . Telephonic visit.   Education: Fall risk prevention has been discussed.  Intervention(s) required? No   Cognitive Function: 6CIT deferred for 2021 AWV; pt has no memory issues.         Screening Tests Health Maintenance  Topic Date Due  . DEXA SCAN  01/24/2018  . PNA vac Low Risk Adult (1 of 2 - PCV13) 01/24/2018  . INFLUENZA VACCINE  05/07/2019 (Originally 09/07/2018)  . MAMMOGRAM  12/15/2020  . COLONOSCOPY  02/28/2026  . TETANUS/TDAP  01/16/2028  . Hepatitis C Screening  Completed    Qualifies for Shingles Vaccine?  Yes  Zostavax completed 2015. Due for Shingrix. Education has been provided regarding the importance of this vaccine. Pt has been advised to call insurance company to determine out of pocket expense. Advised may also receive vaccine at local pharmacy or Health Dept. Verbalized acceptance and understanding.  Tdap: Up to date  Flu Vaccine: Due for Flu vaccine. Does the patient want to receive this vaccine today?  No . Education has been provided regarding the importance of this vaccine but still declined. Advised may receive this vaccine at local pharmacy or Health Dept. Aware to provide a copy of the vaccination record if obtained from local pharmacy or Health Dept. Verbalized acceptance and understanding.  Pneumococcal Vaccine: Due for Pneumococcal vaccine. Does the patient want to receive this vaccine today?  No . Education has been  provided regarding the importance of this vaccine but still declined. Advised may receive this vaccine at local pharmacy or Health Dept. Aware to provide a copy of the vaccination record if obtained from local pharmacy or Health Dept. Verbalized acceptance  and understanding.   Cancer Screenings:  Colorectal Screening: Completed 02/29/16. Repeat every 10 years;   Mammogram: Completed 12/16/18. Repeat every year.  Bone Density: DUE - Ordered 02/06/19. Pt provided with contact information and advised to call to schedule appt.   Lung Cancer Screening: (Low Dose CT Chest recommended if Age 44-80 years, 30 pack-year currently smoking OR have quit w/in 15years.) does not qualify.   Additional Screening:  Hepatitis C Screening: does qualify; Completed 12/03/14  Vision Screening: Recommended annual ophthalmology exams for early detection of glaucoma and other disorders of the eye. Is the patient up to date with their annual eye exam?  Yes  Who is the provider or what is the name of the office in which the pt attends annual eye exams? [facility] Screening: Recommended annual dental exams for proper oral hygiene  Community Resource Referral:  CRR required this visit?  No      Plan:    I have personally reviewed and addressed the Medicare Annual Wellness questionnaire and have noted the following in the patient's chart:  A. Medical and social history B. Use of alcohol, tobacco or illicit drugs  C. Current medications and supplements D. Functional ability and status E.  Nutritional status F.  Physical activity G. Advance directives H. List of other physicians I.  Hospitalizations, surgeries, and ER visits in previous 12 months J.  [place] such as hearing and vision if needed, cognitive and depression L. Referrals and appointments   In addition, I have reviewed and discussed with patient certain preventive protocols, quality metrics, and best practice  recommendations. A written personalized care plan for preventive services as well as general preventive health recommendations were provided to patient.   Signed,  Clemetine Marker, LPN Nurse Health Advisor   Nurse Notes: pt doing well and appreciative of visit today

## 2019-04-03 NOTE — Patient Instructions (Signed)
Ana Craig , Thank you for taking time to come for your Medicare Wellness Visit. I appreciate your ongoing commitment to your health goals. Please review the following plan we discussed and let me know if I can assist you in the future.   Screening recommendations/referrals: Colonoscopy: done 02/29/16. Repeat in 2028.  Mammogram: done 12/16/18  Bone Density: Please call [phone] to schedule your mammogram.  Recommended yearly ophthalmology/optometry visit for glaucoma screening and checkup Recommended yearly dental visit for hygiene and checkup  Vaccinations: Influenza vaccine: postponed Pneumococcal vaccine: due Tdap vaccine: done 01/15/18 Shingles vaccine: Shingrix discussed. Please contact your pharmacy for coverage information.   Conditions/risks identified: Keep up the great work!  Next appointment: Please follow up in one year for your Medicare Annual Wellness visit.     Preventive Care 44 Years and Older, Female Preventive care refers to lifestyle choices and visits with your health care provider that can promote health and wellness. What does preventive care include?  A yearly physical exam. This is also called an annual well check.  Dental exams once or twice a year.  Routine eye exams. Ask your health care provider how often you should have your eyes checked.  Personal lifestyle choices, including:  Daily care of your teeth and gums.  Regular physical activity.  Eating a healthy diet.  Avoiding tobacco and drug use.  Limiting alcohol use.  Practicing safe sex.  Taking low-dose aspirin every day.  Taking vitamin and mineral supplements as recommended by your health care provider. What happens during an annual well check? The services and screenings done by your health care provider during your annual well check will depend on your age, overall health, lifestyle risk factors, and family history of disease. Counseling  Your health care provider may ask you  questions about your:  Alcohol use.  Tobacco use.  Drug use.  Emotional well-being.  Home and relationship well-being.  Sexual activity.  Eating habits.  History of falls.  Memory and ability to understand (cognition).  Work and work Statistician.  Reproductive health. Screening  You may have the following tests or measurements:  Height, weight, and BMI.  Blood pressure.  Lipid and cholesterol levels. These may be checked every 5 years, or more frequently if you are over 20 years old.  Skin check.  Lung cancer screening. You may have this screening every year starting at age 25 if you have a 30-pack-year history of smoking and currently smoke or have quit within the past 15 years.  Fecal occult blood test (FOBT) of the stool. You may have this test every year starting at age 3.  Flexible sigmoidoscopy or colonoscopy. You may have a sigmoidoscopy every 5 years or a colonoscopy every 10 years starting at age 42.  Hepatitis C blood test.  Hepatitis B blood test.  Sexually transmitted disease (STD) testing.  Diabetes screening. This is done by checking your blood sugar (glucose) after you have not eaten for a while (fasting). You may have this done every 1-3 years.  Bone density scan. This is done to screen for osteoporosis. You may have this done starting at age 3.  Mammogram. This may be done every 1-2 years. Talk to your health care provider about how often you should have regular mammograms. Talk with your health care provider about your test results, treatment options, and if necessary, the need for more tests. Vaccines  Your health care provider may recommend certain vaccines, such as:  Influenza vaccine. This is recommended every year.  Tetanus, diphtheria, and acellular pertussis (Tdap, Td) vaccine. You may need a Td booster every 10 years.  Zoster vaccine. You may need this after age 31.  Pneumococcal 13-valent conjugate (PCV13) vaccine. One dose is  recommended after age 15.  Pneumococcal polysaccharide (PPSV23) vaccine. One dose is recommended after age 79. Talk to your health care provider about which screenings and vaccines you need and how often you need them. This information is not intended to replace advice given to you by your health care provider. Make sure you discuss any questions you have with your health care provider. Document Released: 02/19/2015 Document Revised: 10/13/2015 Document Reviewed: 11/24/2014 Elsevier Interactive Patient Education  2017 [place] Prevention in the Home Falls can cause injuries. They can happen to people of all ages. There are many things you can do to make your home safe and to help prevent falls. What can I do on the outside of my home?  Regularly fix the edges of walkways and driveways and fix any cracks.  Remove anything that might make you trip as you walk through a door, such as a raised step or threshold.  Trim any bushes or trees on the path to your home.  Use bright outdoor lighting.  Clear any walking paths of anything that might make someone trip, such as rocks or tools.  Regularly check to see if handrails are loose or broken. Make sure that both sides of any steps have handrails.  Any raised decks and porches should have guardrails on the edges.  Have any leaves, snow, or ice cleared regularly.  Use sand or salt on walking paths during winter.  Clean up any spills in your garage right away. This includes oil or grease spills. What can I do in the bathroom?  Use night lights.  Install grab bars by the toilet and in the tub and shower. Do not use towel bars as grab bars.  Use non-skid mats or decals in the tub or shower.  If you need to sit down in the shower, use a plastic, non-slip stool.  Keep the floor dry. Clean up any water that spills on the floor as soon as it happens.  Remove soap buildup in the tub or shower regularly.  Attach bath mats  securely with double-sided non-slip rug tape.  Do not have throw rugs and other things on the floor that can make you trip. What can I do in the bedroom?  Use night lights.  Make sure that you have a light by your bed that is easy to reach.  Do not use any sheets or blankets that are too big for your bed. They should not hang down onto the floor.  Have a firm chair that has side arms. You can use this for support while you get dressed.  Do not have throw rugs and other things on the floor that can make you trip. What can I do in the kitchen?  Clean up any spills right away.  Avoid walking on wet floors.  Keep items that you use a lot in easy-to-reach places.  If you need to reach something above you, use a strong step stool that has a grab bar.  Keep electrical cords out of the way.  Do not use floor polish or wax that makes floors slippery. If you must use wax, use non-skid floor wax.  Do not have throw rugs and other things on the floor that can make you trip. What can I do  with my stairs?  Do not leave any items on the stairs.  Make sure that there are handrails on both sides of the stairs and use them. Fix handrails that are broken or loose. Make sure that handrails are as long as the stairways.  Check any carpeting to make sure that it is firmly attached to the stairs. Fix any carpet that is loose or worn.  Avoid having throw rugs at the top or bottom of the stairs. If you do have throw rugs, attach them to the floor with carpet tape.  Make sure that you have a light switch at the top of the stairs and the bottom of the stairs. If you do not have them, ask someone to add them for you. What else can I do to help prevent falls?  Wear shoes that:  Do not have high heels.  Have rubber bottoms.  Are comfortable and fit you well.  Are closed at the toe. Do not wear sandals.  If you use a stepladder:  Make sure that it is fully opened. Do not climb a closed  stepladder.  Make sure that both sides of the stepladder are locked into place.  Ask someone to hold it for you, if possible.  Clearly mark and make sure that you can see:  Any grab bars or handrails.  First and last steps.  Where the edge of each step is.  Use tools that help you move around (mobility aids) if they are needed. These include:  Canes.  Walkers.  Scooters.  Crutches.  Turn on the lights when you go into a dark area. Replace any light bulbs as soon as they burn out.  Set up your furniture so you have a clear path. Avoid moving your furniture around.  If any of your floors are uneven, fix them.  If there are any pets around you, be aware of where they are.  Review your medicines with your doctor. Some medicines can make you feel dizzy. This can increase your chance of falling. Ask your doctor what other things that you can do to help prevent falls. This information is not intended to replace advice given to you by your health care provider. Make sure you discuss any questions you have with your health care provider. Document Released: 11/19/2008 Document Revised: 07/01/2015 Document Reviewed: 02/27/2014 Elsevier Interactive Patient Education  2017 [employer].

## 2019-07-08 LAB — HEMOGLOBIN A1C: Hemoglobin A1C: 5.3

## 2019-07-08 LAB — VITAMIN D 25 HYDROXY (VIT D DEFICIENCY, FRACTURES): Vit D, 25-Hydroxy: 40

## 2019-07-08 LAB — LIPID PANEL
Cholesterol: 200 (ref 0–200)
HDL: 70 (ref 35–70)
LDL Cholesterol: 114
Triglycerides: 76 (ref 40–160)

## 2019-07-08 LAB — HEPATIC FUNCTION PANEL
ALT: 26 (ref 7–35)
AST: 17 (ref 13–35)

## 2019-07-08 LAB — BASIC METABOLIC PANEL
Creatinine: 0.7 (ref 0.5–1.1)
Glucose: 94

## 2019-07-08 LAB — TSH: TSH: 2.61 (ref 0.41–5.90)

## 2019-08-07 ENCOUNTER — Other Ambulatory Visit: Payer: Self-pay

## 2019-08-07 ENCOUNTER — Encounter: Payer: Self-pay | Admitting: Family Medicine

## 2019-08-07 ENCOUNTER — Ambulatory Visit (INDEPENDENT_AMBULATORY_CARE_PROVIDER_SITE_OTHER): Payer: Medicare Other | Admitting: Family Medicine

## 2019-08-07 ENCOUNTER — Ambulatory Visit: Payer: Medicare Other | Admitting: Family Medicine

## 2019-08-07 VITALS — BP 110/70 | HR 82 | Temp 97.1°F | Resp 14 | Ht 66.0 in | Wt 129.2 lb

## 2019-08-07 DIAGNOSIS — Z853 Personal history of malignant neoplasm of breast: Secondary | ICD-10-CM | POA: Diagnosis not present

## 2019-08-07 DIAGNOSIS — E039 Hypothyroidism, unspecified: Secondary | ICD-10-CM | POA: Diagnosis not present

## 2019-08-07 DIAGNOSIS — Z5181 Encounter for therapeutic drug level monitoring: Secondary | ICD-10-CM

## 2019-08-07 DIAGNOSIS — M810 Age-related osteoporosis without current pathological fracture: Secondary | ICD-10-CM

## 2019-08-07 DIAGNOSIS — Z780 Asymptomatic menopausal state: Secondary | ICD-10-CM

## 2019-08-07 DIAGNOSIS — K219 Gastro-esophageal reflux disease without esophagitis: Secondary | ICD-10-CM | POA: Diagnosis not present

## 2019-08-07 NOTE — Progress Notes (Signed)
Name: Ana Craig   MRN: [id number]    DOB: [birth year]   Date:08/07/2019       Progress Note  Chief Complaint  Patient presents with  . Follow-up    6 months  . Hypothyroidism  . Pain    Soreness on left side near rib onset yesterday, 08/06/19     Subjective:   Ana Craig is a 69 y.o. female, presents to clinic for routine f/up Hypothyroidism: Current Medication Regimen: 75 mcg synthroid Takes medicine correctly Current Symptoms: denies fatigue, weight changes, heat/cold intolerance, bowel/skin changes or CVS symptoms Most recent results are below; we will be repeating labs today. Lab Results  Component Value Date   TSH 4.41 01/21/2019   Osteoporosis- due for dexa, had more than 10 years ago  Hx breast cancer: Had radiation therapy on the right side that has caused some occasional right rib cage pain.  She complains of left rib cage pain, sharp, began yesterday, improving this am   BP low/soft today, she denies lightheaddedness, near syncope, SOB.  She hasn't eaten or drinken much today BP initially 94/58 and improved with recheck today BP Readings from Last 3 Encounters:  08/07/19 110/70  02/06/19 110/72  01/06/19 100/80     Current Outpatient Medications:  .  Ascorbic Acid (VITAMIN C IMMUNE HEALTH PO), Take by mouth., Disp: , Rfl:  .  Calcium Carb-Cholecalciferol (CALCIUM-VITAMIN D) 500-200 MG-UNIT tablet, Take by mouth., Disp: , Rfl:  .  glucosamine-chondroitin 500-400 MG tablet, Take by mouth., Disp: , Rfl:  .  Multiple Vitamins-Minerals (MULTIVITAMIN WITH MINERALS) tablet, Take 1 tablet by mouth daily., Disp: , Rfl:  .  SYNTHROID 75 MCG tablet, TAKE 1 TABLET BY MOUTH ONCE DAILY, Disp: 90 tablet, Rfl: 3  Patient Active Problem List   Diagnosis Date Noted  . Allergic rhinitis 02/21/2019  . GERD (gastroesophageal reflux disease) 02/21/2019  . Osteoporosis 02/21/2019  . Closed right radial fracture - S/p ORIF - [facility]  05/02/2016  . Headache 12/06/2015  . 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 Hypothyroidism 07/17/2014  . Elevated glucose 07/17/2014  . History of breast cancer 12/16/2012    Past Surgical History:  Procedure Laterality Date  . ABDOMINAL HYSTERECTOMY  1998  . APPENDECTOMY  2002   lap  . BREAST LUMPECTOMY Right 2014  . BREAST LUMPECTOMY WITH NEEDLE LOCALIZATION Right 12/25/2012   Procedure: BREAST LUMPECTOMY WITH NEEDLE LOCALIZATION;  Surgeon: Edward Jolly, MD;  Location: [facility];  Service: General;  Laterality: Right;  . COLONOSCOPY    . COLONOSCOPY WITH PROPOFOL N/A 02/29/2016   Procedure: COLONOSCOPY WITH PROPOFOL;  Surgeon: Garlan Fair, MD;  Location: [facility];  Service: Endoscopy;  Laterality: N/A;  . OPEN REDUCTION INTERNAL FIXATION (ORIF) DISTAL RADIAL FRACTURE Right 04/2016   [facility]  . TONSILLECTOMY      Family History  Problem Relation Age of Onset  . COPD Mother     Social History   Tobacco Use  . Smoking status: Never Smoker  . Smokeless tobacco: Never Used  Vaping Use  . Vaping Use: Never used  Substance Use Topics  . Alcohol use: No    Alcohol/week: 0.0 standard drinks  . Drug use: No     Allergies  Allergen Reactions  . Codeine Other (See Comments)  . Penicillins Rash    Has patient had a PCN reaction causing immediate rash, facial/tongue/throat swelling, SOB or lightheadedness with hypotension: {unknown Has patient had a PCN reaction causing severe rash involving mucus membranes  or skin necrosis: {no Has patient had a PCN reaction that required hospitalization {no Has patient had a PCN reaction occurring within the last 10 years: {unknown If all of the above answers are "NO", then may proceed with Cephalosporin use.    Health Maintenance  Topic Date Due  . COVID-19 Vaccine (1) Never done  . DEXA SCAN  Never done  . PNA vac Low Risk Adult (1 of 2 - PCV13) Never done  . INFLUENZA VACCINE  09/07/2019  . MAMMOGRAM  12/15/2020  . COLONOSCOPY  02/28/2026  .  TETANUS/TDAP  01/16/2028  . Hepatitis C Screening  Completed    Chart Review Today: I personally reviewed active problem list, medication list, allergies, family history, social history, health maintenance, notes from last encounter, lab results, imaging with the patient/caregiver today.   Review of Systems  10 Systems reviewed and are negative for acute change except as noted in the HPI.    Objective:   Vitals:   08/07/19 0926 08/07/19 0928  BP: (!) 94/58 110/70  Pulse: 82   Resp: 14   Temp: (!) 97.1 F (36.2 C)   SpO2: 96%   Weight: 129 lb 3.2 oz (58.6 kg)   Height: 5\' 6"  (1.676 m)     Body mass index is 20.85 kg/m.  Physical Exam Vitals and nursing note reviewed.  Constitutional:      General: She is not in acute distress.    Appearance: Normal appearance. She is well-developed. She is not ill-appearing, toxic-appearing or diaphoretic.     Interventions: Face mask in place.  HENT:     Head: Normocephalic and atraumatic.     Right Ear: External ear normal.     Left Ear: External ear normal.  Eyes:     General: Lids are normal. No scleral icterus.       Right eye: No discharge.        Left eye: No discharge.     Conjunctiva/sclera: Conjunctivae normal.  Neck:     Trachea: Phonation normal. No tracheal deviation.  Cardiovascular:     Rate and Rhythm: Normal rate and regular rhythm.     Pulses: Normal pulses.          Radial pulses are 2+ on the right side and 2+ on the left side.       Posterior tibial pulses are 2+ on the right side and 2+ on the left side.     Heart sounds: Normal heart sounds. No murmur heard.  No friction rub. No gallop.   Pulmonary:     Effort: Pulmonary effort is normal. No respiratory distress.     Breath sounds: Normal breath sounds. No stridor. No wheezing, rhonchi or rales.  Chest:     Chest wall: No tenderness (no focal tenderness and no chest wall abnormality).  Abdominal:     General: Bowel sounds are normal. There is no  distension.     Palpations: Abdomen is soft.     Tenderness: There is no abdominal tenderness. There is no guarding or rebound.  Musculoskeletal:        General: No deformity. Normal range of motion.     Cervical back: Normal range of motion and neck supple.     Right lower leg: No edema.     Left lower leg: No edema.  Lymphadenopathy:     Cervical: No cervical adenopathy.  Skin:    General: Skin is warm and dry.     Capillary Refill: Capillary refill takes  less than 2 seconds.     Coloration: Skin is not jaundiced or pale.     Findings: No rash.  Neurological:     Mental Status: She is alert and oriented to person, place, and time.     Motor: No abnormal muscle tone.     Gait: Gait normal.  Psychiatric:        Speech: Speech normal.        Behavior: Behavior normal.         Assessment & Plan:   1. Hypothyroidism, unspecified type Labs to monitor med/dosing, no sx currently concerning for wrong dose - TSH - CBC with Differential/Platelet  2. Osteoporosis, unspecified osteoporosis type, unspecified pathological fracture presence Due for f/up dexa Check calcium, she is taking appropriate Vit D and Calcium supplement Reviewed other osteoporosis tx and prevention - COMPLETE METABOLIC PANEL WITH GFR  3. Gastroesophageal reflux disease, unspecified whether esophagitis present Mild sx, encouraged continued diet/lifestyles, OTC meds  4. History of breast cancer S/p tx and radiation, some intermittent CP/rib pain, likely postradiation changes/sx/costochondritis?  5. Encounter for medication monitoring  - TSH - COMPLETE METABOLIC PANEL WITH GFR - CBC with Differential/Platelet  6. Postmenopausal estrogen deficiency  - DG Bone Density; Future   Return in about 6 months (around 02/07/2020) for Routine follow-up.   Delsa Grana, PA-C 08/07/19 9:46 AM

## 2019-08-07 NOTE — Patient Instructions (Signed)
[facility] at [place],  [place]  [address] Get Driving Directions Main: [phone]  Call to schedule your bone density scan   Preventing Osteoporosis, Adult Osteoporosis is a condition that causes the bones to lose density. This means that the bones become thinner, and the normal spaces in bone tissue become larger. Low bone density can make the bones weak and cause them to break more easily. Osteoporosis cannot always be prevented, but you can take steps to lower your risk of developing this condition. How can this condition affect me? If you develop osteoporosis, you will be more likely to break bones in your wrist, spine, or hip. Even a minor accident or injury can be enough to break weak bones. The bones will also be slower to heal. Osteoporosis can cause other problems as well, such as a stooped posture or trouble with movement. Osteoporosis can occur with aging. As you get older, you may lose bone tissue more quickly, or it may be replaced more slowly. Osteoporosis is more likely to develop if you have poor nutrition or do not get enough calcium or vitamin D. Other lifestyle factors can also play a role. By eating a well-balanced diet and making lifestyle changes, you can help keep your bones strong and healthy, lowering your chances of developing osteoporosis. What can increase my risk? The following factors may make you more likely to develop osteoporosis:  Having a family history of the condition.  Having poor nutrition or not getting enough calcium or vitamin D.  Using certain medicines, such as steroid medicines or antiseizure medicines.  Being any of the following: ? 30 years of age or older. ? Female. ? A woman who has gone through menopause (is postmenopausal). ? White (Caucasian) or of Asian descent.  Smoking or having a history of smoking.  Not being physically active (being sedentary).  Having a small body  frame. What actions can I take to prevent this?  Get enough calcium   Make sure you get enough calcium every day. Calcium is the most important mineral for bone health. Most people can get enough calcium from their diet, but supplements may be recommended for people who are at risk for osteoporosis. Follow these guidelines: ? If you are age 74 or younger, aim to get 1,000 mg of calcium every day. ? If you are older than age 41, aim to get 1,200 mg of calcium every day.  Good sources of calcium include: ? Dairy products, such as low-fat or nonfat milk, cheese, and yogurt. ? Dark green leafy vegetables, such as bok choy and broccoli. ? Foods that have had calcium added to them (calcium-fortified foods), such as orange juice, cereal, bread, soy beverages, and tofu products. ? Nuts, such as almonds.  Check nutrition labels to see how much calcium is in a food or drink. Get enough vitamin D  Try to get enough vitamin D every day. Vitamin D is the most essential vitamin for bone health. It helps the body absorb calcium. Follow these guidelines for how much vitamin D to get from food: ? If you are age 26 or younger, aim to get at least 600 international units (IU) every day. Your health care provider may suggest more. ? If you are older than age 2, aim to get at least 800 international units every day. Your health care provider may suggest more.  Good sources of vitamin D in your diet include: ? Egg yolks. ? Oily fish,  such as salmon, sardines, and tuna. ? Milk and cereal fortified with vitamin D.  Your body also makes vitamin D when you are out in the sun. Exposing the bare skin on your face, arms, legs, or back to the sun for no more than 30 minutes a day, 2 times a week is more than enough. Beyond that, make sure you use sunblock to protect your skin from sunburn, which increases your risk for skin cancer. Exercise  Stay active and get exercise every day.  Ask your health care provider  what types of exercise are best for you. Weight-bearing and strength-building activities are important for building and maintaining healthy bones. Some examples of these types of activities include: ? Walking and hiking. ? Jogging and running. ? Dancing. ? Gym exercises. ? Lifting weights. ? Tennis and racquetball. ? Climbing stairs. ? Aerobics. Make other lifestyle changes  Do not use any products that contain nicotine or tobacco, such as cigarettes, e-cigarettes, and chewing tobacco. If you need help quitting, ask your health care provider.  Lose weight if you are overweight.  If you drink alcohol: ? Limit how much you use to:  0-1 drink a day for nonpregnant women.  0-2 drinks a day for men. ? Be aware of how much alcohol is in your drink. In the [place] one drink equals one 12 oz bottle of beer (355 mL), one 5 oz glass of wine (148 mL), or one 1 oz glass of hard liquor (44 mL). Where to find support If you need help making changes to prevent osteoporosis, talk with your health care provider. You can ask for a referral to a diet and nutrition specialist (dietitian) and a physical therapist. Where to find more information Learn more about osteoporosis from:  NIH Osteoporosis and Related [place]: www.bones.SouthExposed.es  [place] Office on [employer]: [place]s.tn  [place]: EquipmentWeekly.com.ee Summary  Osteoporosis is a condition that causes weak bones that are more likely to break.  Eat a healthy diet, making sure you get enough calcium and vitamin D, and stay active by getting regular exercise to help prevent osteoporosis.  Other ways to reduce your risk of osteoporosis include maintaining a healthy weight and avoiding alcohol and products that contain nicotine or tobacco. This information is not intended to replace advice given to you by your health care provider. Make sure you discuss any questions you have with your health  care provider. Document Revised: 08/23/2018 Document Reviewed: 08/23/2018 Elsevier Patient Education  [place].

## 2019-08-08 ENCOUNTER — Other Ambulatory Visit: Payer: Self-pay | Admitting: Family Medicine

## 2019-08-08 ENCOUNTER — Encounter: Payer: Self-pay | Admitting: Family Medicine

## 2019-08-08 DIAGNOSIS — E039 Hypothyroidism, unspecified: Secondary | ICD-10-CM

## 2019-08-08 LAB — CBC WITH DIFFERENTIAL/PLATELET
Absolute Monocytes: 350 cells/uL (ref 200–950)
Basophils Absolute: 48 cells/uL (ref 0–200)
Basophils Relative: 1 %
Eosinophils Absolute: 139 cells/uL (ref 15–500)
Eosinophils Relative: 2.9 %
HCT: 35.9 % (ref 35.0–45.0)
Hemoglobin: 11.7 g/dL (ref 11.7–15.5)
Lymphs Abs: 1075 cells/uL (ref 850–3900)
MCH: 32.2 pg (ref 27.0–33.0)
MCHC: 32.6 g/dL (ref 32.0–36.0)
MCV: 98.9 fL (ref 80.0–100.0)
MPV: 10.3 fL (ref 7.5–12.5)
Monocytes Relative: 7.3 %
Neutro Abs: 3187 cells/uL (ref 1500–7800)
Neutrophils Relative %: 66.4 %
Platelets: 297 10*3/uL (ref 140–400)
RBC: 3.63 10*6/uL — ABNORMAL LOW (ref 3.80–5.10)
RDW: 12.3 % (ref 11.0–15.0)
Total Lymphocyte: 22.4 %
WBC: 4.8 10*3/uL (ref 3.8–10.8)

## 2019-08-08 LAB — COMPLETE METABOLIC PANEL WITH GFR
AG Ratio: 1.8 (calc) (ref 1.0–2.5)
ALT: 15 U/L (ref 6–29)
AST: 19 U/L (ref 10–35)
Albumin: 4.4 g/dL (ref 3.6–5.1)
Alkaline phosphatase (APISO): 56 U/L (ref 37–153)
BUN: 20 mg/dL (ref 7–25)
CO2: 29 mmol/L (ref 20–32)
Calcium: 9.3 mg/dL (ref 8.6–10.4)
Chloride: 105 mmol/L (ref 98–110)
Creat: 0.92 mg/dL (ref 0.50–0.99)
GFR, Est African American: 75 mL/min/{1.73_m2} (ref >=60)
GFR, Est Non African American: 65 mL/min/{1.73_m2} (ref >=60)
Globulin: 2.4 g/dL (calc) (ref 1.9–3.7)
Glucose, Bld: 87 mg/dL (ref 65–99)
Potassium: 4.1 mmol/L (ref 3.5–5.3)
Sodium: 139 mmol/L (ref 135–146)
Total Bilirubin: 0.7 mg/dL (ref 0.2–1.2)
Total Protein: 6.8 g/dL (ref 6.1–8.1)

## 2019-08-08 LAB — TSH: TSH: 0.77 mIU/L (ref 0.40–4.50)

## 2019-08-08 MED ORDER — SYNTHROID 75 MCG PO TABS: ORAL_TABLET | ORAL | 3 refills | Status: AC

## 2019-08-18 ENCOUNTER — Ambulatory Visit
Admission: RE | Admit: 2019-08-18 | Discharge: 2019-08-18 | Disposition: A | Discharge status: Home / Self Care | Payer: Medicare Other | Source: Ambulatory Visit | Attending: Family Medicine | Admitting: Family Medicine

## 2019-08-18 DIAGNOSIS — Z780 Asymptomatic menopausal state: Secondary | ICD-10-CM | POA: Diagnosis not present

## 2019-09-14 NOTE — Progress Notes (Deleted)
Delsa Grana, PA-C   No chief complaint on file.   HPI:      [name] is a 69 y.o. G0P0000 whose LMP was No LMP recorded. Patient has had a hysterectomy., presents today for ***    Past Medical History:  Diagnosis Date  . Arthritis   . Basal cell carcinoma 02/06/2014   Skin of chest  . 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 Hypothyroidism   . Osteoporosis   . Personal history of radiation therapy 2014   Right Breast Cancer  . PONV (postoperative nausea and vomiting)   . Wears glasses     Past Surgical History:  Procedure Laterality Date  . ABDOMINAL HYSTERECTOMY  1998  . APPENDECTOMY  2002   lap  . BREAST LUMPECTOMY Right 2014  . BREAST LUMPECTOMY WITH NEEDLE LOCALIZATION Right 12/25/2012   Procedure: BREAST LUMPECTOMY WITH NEEDLE LOCALIZATION;  Surgeon: Edward Jolly, MD;  Location: [facility];  Service: General;  Laterality: Right;  . COLONOSCOPY    . COLONOSCOPY WITH PROPOFOL N/A 02/29/2016   Procedure: COLONOSCOPY WITH PROPOFOL;  Surgeon: Garlan Fair, MD;  Location: [facility];  Service: Endoscopy;  Laterality: N/A;  . OPEN REDUCTION INTERNAL FIXATION (ORIF) DISTAL RADIAL FRACTURE Right 04/2016   [facility]  . TONSILLECTOMY      Family History  Problem Relation Age of Onset  . COPD Mother     Social History   Socioeconomic History  . Marital status: Widowed    Spouse name: Not on file  . Number of children: 0  . Years of education: Not on file  . Highest education level: Not on file  Occupational History  . Not on file  Tobacco Use  . Smoking status: Never Smoker  . Smokeless tobacco: Never Used  Vaping Use  . Vaping Use: Never used  Substance and Sexual Activity  . Alcohol use: No    Alcohol/week: 0.0 standard drinks  . Drug use: No  . Sexual activity: Not Currently    Partners: Male    Birth control/protection: Post-menopausal  Other Topics Concern  . Not on file   Social History Narrative  . Not on file   Social Determinants of Health   Financial Resource Strain: Low Risk   . Difficulty of Paying Living Expenses: Not hard at all  Food Insecurity: No Food Insecurity  . Worried About Charity fundraiser in the Last Year: Never true  . Ran Out of Food in the Last Year: Never true  Transportation Needs: No Transportation Needs  . Lack of Transportation (Medical): No  . Lack of Transportation (Non-Medical): No  Physical Activity: Sufficiently Active  . Days of Exercise per Week: 3 days  . Minutes of Exercise per Session: 90 min  Stress: No Stress Concern Present  . Feeling of Stress : Not at all  Social Connections: Moderately Isolated  . Frequency of Communication with Friends and Family: More than three times a week  . Frequency of Social Gatherings with Friends and Family: Once a week  . Attends Religious Services: More than 4 times per year  . Active Member of Clubs or Organizations: No  . Attends Archivist Meetings: Never  . Marital Status: Widowed  Intimate Partner Violence: Not At Risk  . Fear of Current or Ex-Partner: No  . Emotionally Abused: No  . Physically Abused: No  . Sexually Abused: No  Outpatient Medications Prior to Visit  Medication Sig Dispense Refill  . Ascorbic Acid (VITAMIN C IMMUNE HEALTH PO) Take by mouth.    . Calcium Carb-Cholecalciferol (CALCIUM-VITAMIN D) 500-200 MG-UNIT tablet Take by mouth.    Marland Kitchen glucosamine-chondroitin 500-400 MG tablet Take by mouth.    . Multiple Vitamins-Minerals (MULTIVITAMIN WITH MINERALS) tablet Take 1 tablet by mouth daily.    Marland Kitchen SYNTHROID 75 MCG tablet TAKE 1 TABLET BY MOUTH ONCE DAILY 90 tablet 3   No facility-administered medications prior to visit.      ROS:  Review of Systems BREAST: No symptoms   OBJECTIVE:   Vitals:  There were no vitals taken for this visit.  Physical Exam  Results: No results found for this or any previous visit (from the past 24  hour(s)).   Assessment/Plan: No diagnosis found.    No orders of the defined types were placed in this encounter.     No follow-ups on file.  Kathreen Dileo B. Nickolas Chalfin, PA-C 09/14/2019 3:43 PM

## 2019-09-15 ENCOUNTER — Ambulatory Visit: Payer: Medicare Other | Admitting: Obstetrics and Gynecology

## 2019-11-05 ENCOUNTER — Other Ambulatory Visit: Payer: Self-pay | Admitting: Family Medicine

## 2019-11-05 DIAGNOSIS — Z1231 Encounter for screening mammogram for malignant neoplasm of breast: Secondary | ICD-10-CM

## 2019-12-03 ENCOUNTER — Encounter: Payer: Self-pay | Admitting: Family Medicine

## 2019-12-22 ENCOUNTER — Other Ambulatory Visit: Payer: Self-pay

## 2019-12-22 ENCOUNTER — Ambulatory Visit
Admission: RE | Admit: 2019-12-22 | Discharge: 2019-12-22 | Disposition: A | Discharge status: Home / Self Care | Payer: Medicare Other | Source: Ambulatory Visit | Attending: Family Medicine | Admitting: Family Medicine

## 2019-12-22 DIAGNOSIS — Z1231 Encounter for screening mammogram for malignant neoplasm of breast: Secondary | ICD-10-CM

## 2020-01-28 ENCOUNTER — Encounter: Payer: Self-pay | Admitting: Family Medicine

## 2020-02-10 ENCOUNTER — Ambulatory Visit: Payer: Medicare Other | Admitting: Family Medicine

## 2020-02-10 NOTE — Progress Notes (Signed)
PCP: [name], PA-C   Chief Complaint  Patient presents with  . Gynecologic Exam    HPI:      [name] is a 70 y.o. No obstetric history on file. who LMP was No LMP recorded. Patient has had a hysterectomy., presents today for her MEDICARE annual examination.  Her menses are absent due to menopause/hyst. She does not have PMB.  She does not have vasomotor sx.   Sex activity: not sexually active. She does not have vaginal dryness/sx.  Last Pap: 01/24/16  Results were: no abnormalities /neg HPV DNA.;  No longer indicated Hx of STDs: none Gets recurrent pimple type lesion RT ing area; occas drains a little; very tender since on underwear line. Resolves fully but then recurs  Last mammogram: 12/22/19  Results were: normal--routine follow-up in 12 months There is no FH of breast cancer. There is no FH of ovarian cancer. The patient does do self-breast exams. Pt is s/p RT breast stage 0 breast cancer 2014 with lumpectomy and radiation. Pt declined tamoxifen tx. Just doing yearly mammos now. Doing well  Colonoscopy: colonoscopy 4 years ago without abnormalities.  Repeat due after 10 years.   Tobacco use: The patient denies current or previous tobacco use. Alcohol use: none  No drug use Exercise: very active  DEXA: 7/21 with osteoporosis in hip, osteopenia in spine. 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 PCP offered other meds, pt declines  She does get adequate calcium and Vitamin D in her diet.  Labs with PCP.   Past Medical History:  Diagnosis Date  . Arthritis   . Basal cell carcinoma 02/06/2014   Skin of chest  . 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 Hypothyroidism   . Osteoporosis    hip 7/21  . Personal history of radiation therapy 2014   Right Breast Cancer  . PONV (postoperative nausea and vomiting)   . Wears glasses     Past Surgical History:  Procedure  Laterality Date  . ABDOMINAL HYSTERECTOMY  1998  . APPENDECTOMY  2002   lap  . BREAST LUMPECTOMY Right 2014  . BREAST LUMPECTOMY WITH NEEDLE LOCALIZATION Right 12/25/2012   Procedure: BREAST LUMPECTOMY WITH NEEDLE LOCALIZATION;  Surgeon: Mariella Saa, MD;  Location: [facility];  Service: General;  Laterality: Right;  . COLONOSCOPY    . COLONOSCOPY WITH PROPOFOL N/A 02/29/2016   Procedure: COLONOSCOPY WITH PROPOFOL;  Surgeon: Charolett Bumpers, MD;  Location: [facility];  Service: Endoscopy;  Laterality: N/A;  . OPEN REDUCTION INTERNAL FIXATION (ORIF) DISTAL RADIAL FRACTURE Right 04/2016   [place]  . TONSILLECTOMY      Family History  Problem Relation Age of Onset  . COPD Mother     Social History   Socioeconomic History  . Marital status: Widowed    Spouse name: Not on file  . Number of children: 0  . Years of education: Not on file  . Highest education level: Not on file  Occupational History  . Not on file  Tobacco Use  . Smoking status: Never Smoker  . Smokeless tobacco: Never Used  Vaping Use  . Vaping Use: Never used  Substance and Sexual Activity  . Alcohol use: No    Alcohol/week: 0.0 standard drinks  . Drug use: No  . Sexual activity: Not Currently    Partners: Male    Birth  control/protection: Post-menopausal  Other Topics Concern  . Not on file  Social History Narrative  . Not on file   Social Determinants of Health   Financial Resource Strain: Low Risk   . Difficulty of Paying Living Expenses: Not hard at all  Food Insecurity: No Food Insecurity  . Worried About Programme researcher, broadcasting/film/video in the Last Year: Never true  . Ran Out of Food in the Last Year: Never true  Transportation Needs: No Transportation Needs  . Lack of Transportation (Medical): No  . Lack of Transportation (Non-Medical): No  Physical Activity: Sufficiently Active  . Days of Exercise per Week: 3 days  . Minutes of Exercise per Session: 90 min  Stress: No Stress  Concern Present  . Feeling of Stress : Not at all  Social Connections: Moderately Isolated  . Frequency of Communication with Friends and Family: More than three times a week  . Frequency of Social Gatherings with Friends and Family: Once a week  . Attends Religious Services: More than 4 times per year  . Active Member of Clubs or Organizations: No  . Attends Banker Meetings: Never  . Marital Status: Widowed  Intimate Partner Violence: Not At Risk  . Fear of Current or Ex-Partner: No  . Emotionally Abused: No  . Physically Abused: No  . Sexually Abused: No    Current Meds  Medication Sig  . Ascorbic Acid (VITAMIN C IMMUNE HEALTH PO) Take by mouth.  . Calcium Carb-Cholecalciferol (CALCIUM-VITAMIN D) 500-200 MG-UNIT tablet Take by mouth.  Marland Kitchen glucosamine-chondroitin 500-400 MG tablet Take by mouth.  . Multiple Vitamins-Minerals (MULTIVITAMIN WITH MINERALS) tablet Take 1 tablet by mouth daily.  Marland Kitchen SYNTHROID 75 MCG tablet TAKE 1 TABLET BY MOUTH ONCE DAILY      ROS:  Review of Systems  Constitutional: Negative for fatigue, fever and unexpected weight change.  Respiratory: Negative for cough, shortness of breath and wheezing.   Cardiovascular: Negative for chest pain, palpitations and leg swelling.  Gastrointestinal: Negative for blood in stool, constipation, diarrhea, nausea and vomiting.  Endocrine: Negative for cold intolerance, heat intolerance and polyuria.  Genitourinary: Negative for dyspareunia, dysuria, flank pain, frequency, genital sores, hematuria, menstrual problem, pelvic pain, urgency, vaginal bleeding, vaginal discharge and vaginal pain.  Musculoskeletal: Negative for back pain, joint swelling and myalgias.  Skin: Negative for rash.  Neurological: Negative for dizziness, syncope, light-headedness, numbness and headaches.  Hematological: Negative for adenopathy.  Psychiatric/Behavioral: Negative for agitation, confusion, sleep disturbance and suicidal  ideas. The patient is not nervous/anxious.      Objective: BP 106/62   Pulse 74   Temp 98.3 F (36.8 C)   Ht 5\' 5"  (1.651 m)   Wt 132 lb 6.4 oz (60.1 kg)   SpO2 99%   BMI 22.03 kg/m    Physical Exam Constitutional:      Appearance: She is well-developed.  Genitourinary:     Vulva and vagina normal.     Genitourinary Comments: UTERUS/CX SURG REM     Right Labia: No tenderness or lesions.    Left Labia: lesions.     Left Labia: No tenderness.       Vaginal cuff intact.    No vaginal discharge, erythema or tenderness.      Right Adnexa: not tender and no mass present.    Left Adnexa: not tender and no mass present.    Cervix is absent.     Uterus is absent.  Breasts:     Right: No mass,  nipple discharge, skin change or tenderness.     Left: No mass, nipple discharge, skin change or tenderness.    Neck:     Thyroid: No thyromegaly.  Cardiovascular:     Rate and Rhythm: Normal rate and regular rhythm.     Heart sounds: Normal heart sounds. No murmur heard.   Pulmonary:     Effort: Pulmonary effort is normal.     Breath sounds: Normal breath sounds.  Abdominal:     Palpations: Abdomen is soft.     Tenderness: There is no abdominal tenderness. There is no guarding.  Musculoskeletal:        General: Normal range of motion.     Cervical back: Normal range of motion.  Neurological:     General: No focal deficit present.     Mental Status: She is alert and oriented to person, place, and time.     Cranial Nerves: No cranial nerve deficit.  Skin:    General: Skin is warm and dry.  Psychiatric:        Mood and Affect: Mood normal.        Behavior: Behavior normal.        Thought Content: Thought content normal.        Judgment: Judgment normal.  Vitals reviewed.     Assessment/Plan:  Encounter for annual routine gynecological examination  Encounter for screening mammogram for malignant neoplasm of breast; pt current on mammo, cont yearly.   Skin  lesion--RT ing area; warm compresses, minimize underwear rubbing area. F/u prn.         GYN counsel breast self exam, mammography screening, menopause, adequate intake of calcium and vitamin D, diet and exercise    F/U  Return in about 2 years (around 02/10/2022).  Hazle Ogburn B. Dessirae Scarola, PA-C 02/11/2020 11:54 AM

## 2020-02-11 ENCOUNTER — Other Ambulatory Visit: Payer: Self-pay

## 2020-02-11 ENCOUNTER — Encounter: Payer: Self-pay | Admitting: Obstetrics and Gynecology

## 2020-02-11 ENCOUNTER — Ambulatory Visit (INDEPENDENT_AMBULATORY_CARE_PROVIDER_SITE_OTHER): Payer: Medicare Other | Admitting: Obstetrics and Gynecology

## 2020-02-11 VITALS — BP 106/62 | HR 74 | Temp 98.3°F | Ht 65.0 in | Wt 132.4 lb

## 2020-02-11 DIAGNOSIS — Z01419 Encounter for gynecological examination (general) (routine) without abnormal findings: Secondary | ICD-10-CM | POA: Diagnosis not present

## 2020-02-11 DIAGNOSIS — Z1231 Encounter for screening mammogram for malignant neoplasm of breast: Secondary | ICD-10-CM

## 2020-02-11 NOTE — Patient Instructions (Signed)
I value your feedback and you entrusting us with your care. If you get a [facility] patient survey, I would appreciate you taking the time to let us know about your experience today. Thank you! ? ? ?

## 2020-04-06 ENCOUNTER — Ambulatory Visit: Payer: Self-pay

## 2020-04-06 ENCOUNTER — Telehealth: Payer: Self-pay | Admitting: Family Medicine

## 2020-04-06 NOTE — Telephone Encounter (Signed)
Copied from [place] [id number]. Topic: Medicare AWV >> Apr 06, 2020  2:15 PM Cher Nakai R wrote: Reason for CRM:  Left message for patient to call back and schedule Medicare Annual Wellness Visit (AWV) in office.   If unable to come into the office for AWV,  please offer to do virtually or by telephone.  Last AWV: 04/03/2019  Please schedule at anytime with [place].  40 minute appointment  Any questions, please contact me at [phone]

## 2020-04-30 ENCOUNTER — Telehealth: Payer: Self-pay | Admitting: Family Medicine

## 2020-04-30 NOTE — Telephone Encounter (Signed)
Copied from [place] [id number]. Topic: Medicare AWV >> Apr 30, 2020 11:52 AM Cher Nakai R wrote: Reason for CRM:  Left message for patient to call back and schedule Medicare Annual Wellness Visit (AWV) in office.   If unable to come into the office for AWV,  please offer to do virtually or by telephone.  Last AWV: 04/03/2019  Please schedule at anytime with [place].  40 minute appointment  Any questions, please contact me at [phone]

## 2020-05-19 ENCOUNTER — Telehealth: Payer: Self-pay | Admitting: Family Medicine

## 2020-05-19 NOTE — Telephone Encounter (Signed)
Copied from [place] [id number]. Topic: Medicare AWV >> May 19, 2020 10:07 AM Cher Nakai R wrote: Reason for CRM:  Left message for patient to call back and schedule Medicare Annual Wellness Visit (AWV) in office.   If unable to come into the office for AWV,  please offer to do virtually or by telephone.  Last AWV:  04/03/2019  Please schedule at anytime with [place].  40 minute appointment  Any questions, please contact me at [phone]

## 2020-06-29 ENCOUNTER — Telehealth: Payer: Self-pay | Admitting: Family Medicine

## 2020-06-29 NOTE — Telephone Encounter (Signed)
Copied from [place] [id number]. Topic: Medicare AWV >> Jun 29, 2020 10:44 AM Cher Nakai R wrote: Reason for CRM:  No answer unable to leave a message for patient to call back and schedule Medicare Annual Wellness Visit (AWV) in office.   If unable to come into the office for AWV,  please offer to do virtually or by telephone.  Last AWV:   04/03/2019  Please schedule at anytime with [place].  40 minute appointment  Any questions, please contact me at [phone]

## 2020-07-19 ENCOUNTER — Telehealth: Payer: Self-pay | Admitting: Family Medicine

## 2020-07-19 NOTE — Telephone Encounter (Signed)
Copied from [place] [phone]. Topic: Medicare AWV >> Jul 19, 2020 12:54 PM Cher Nakai R wrote: Reason for CRM: Left message for patient to call back and schedule Medicare Annual Wellness Visit (AWV) in office.   If unable to come into the office for AWV,  please offer to do virtually or by telephone.  Last AWV: 04/02/2018  Please schedule at anytime with [place].  40 minute appointment  Any questions, please contact me at [phone]

## 2020-07-19 NOTE — Telephone Encounter (Signed)
Copied from [place] [phone]. Topic: Medicare AWV >> Jul 19, 2020 12:54 PM Cher Nakai R wrote: Reason for CRM: Left message for patient to call back and schedule Medicare Annual Wellness Visit (AWV) in office.   If unable to come into the office for AWV,  please offer to do virtually or by telephone.  Last AWV: 04/02/2018  Please schedule at anytime with [place].  40 minute appointment  Any questions, please contact me at [phone]

## 2020-11-11 ENCOUNTER — Other Ambulatory Visit: Payer: Self-pay | Admitting: Family Medicine

## 2020-11-11 DIAGNOSIS — Z1231 Encounter for screening mammogram for malignant neoplasm of breast: Secondary | ICD-10-CM

## 2020-12-22 ENCOUNTER — Other Ambulatory Visit: Payer: Self-pay

## 2020-12-22 ENCOUNTER — Ambulatory Visit
Admission: RE | Admit: 2020-12-22 | Discharge: 2020-12-22 | Disposition: A | Discharge status: Home / Self Care | Payer: Medicare Other | Source: Ambulatory Visit | Attending: Family Medicine | Admitting: Family Medicine

## 2020-12-22 DIAGNOSIS — Z1231 Encounter for screening mammogram for malignant neoplasm of breast: Secondary | ICD-10-CM

## 2021-10-24 ENCOUNTER — Other Ambulatory Visit: Payer: Self-pay | Admitting: Family Medicine

## 2021-10-24 DIAGNOSIS — Z1231 Encounter for screening mammogram for malignant neoplasm of breast: Secondary | ICD-10-CM

## 2022-01-13 ENCOUNTER — Ambulatory Visit
Admission: RE | Admit: 2022-01-13 | Discharge: 2022-01-13 | Disposition: A | Discharge status: Home / Self Care | Payer: Medicare Other | Source: Ambulatory Visit | Attending: Family Medicine | Admitting: Family Medicine

## 2022-01-13 DIAGNOSIS — Z1231 Encounter for screening mammogram for malignant neoplasm of breast: Secondary | ICD-10-CM

## 2022-04-28 IMAGING — MG MM DIGITAL SCREENING BILAT W/ TOMO AND CAD
8 series · 9 of 24 positions shown · non-contrast
Comparison: Previous exam(s).

CLINICAL DATA: Screening.

EXAM:
DIGITAL SCREENING BILATERAL MAMMOGRAM WITH TOMOSYNTHESIS AND CAD
TECHNIQUE: Bilateral screening digital craniocaudal and mediolateral oblique
mammograms were obtained. Bilateral screening digital breast
tomosynthesis was performed. The images were evaluated with
computer-aided detection.

[R MLO synth-2D]
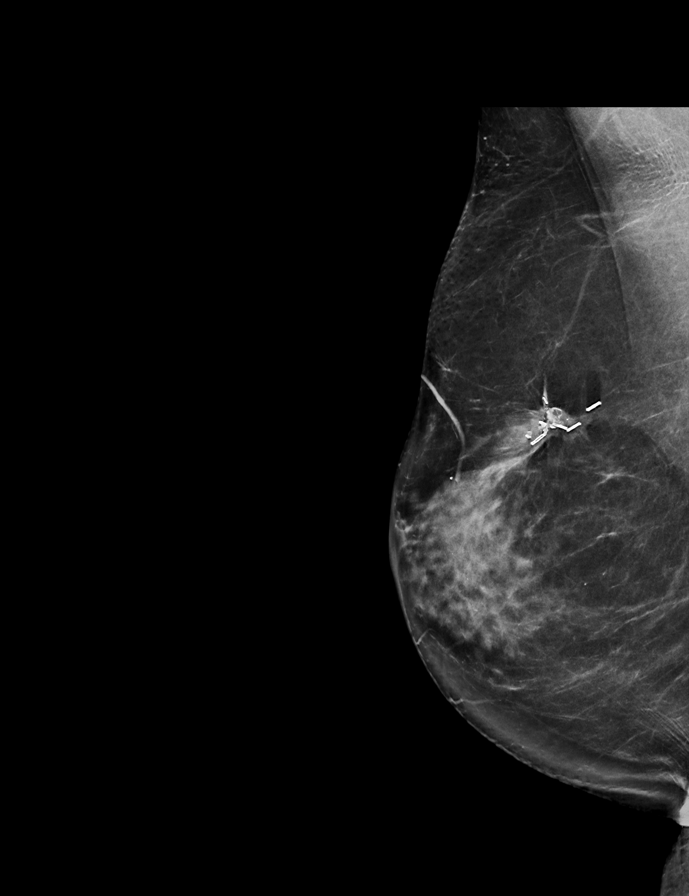

[L MLO synth-2D]
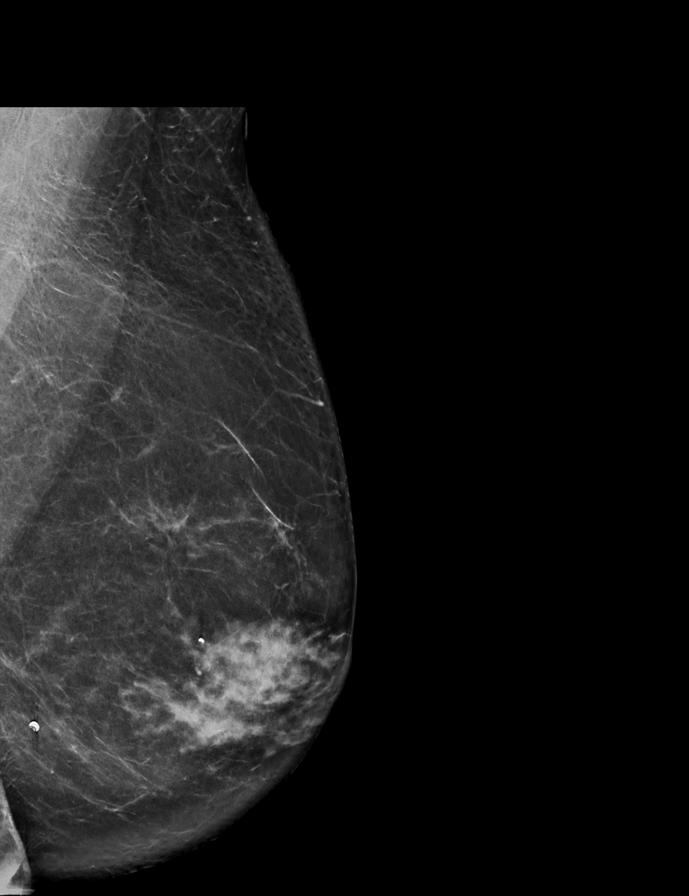

[L CC synth-2D]
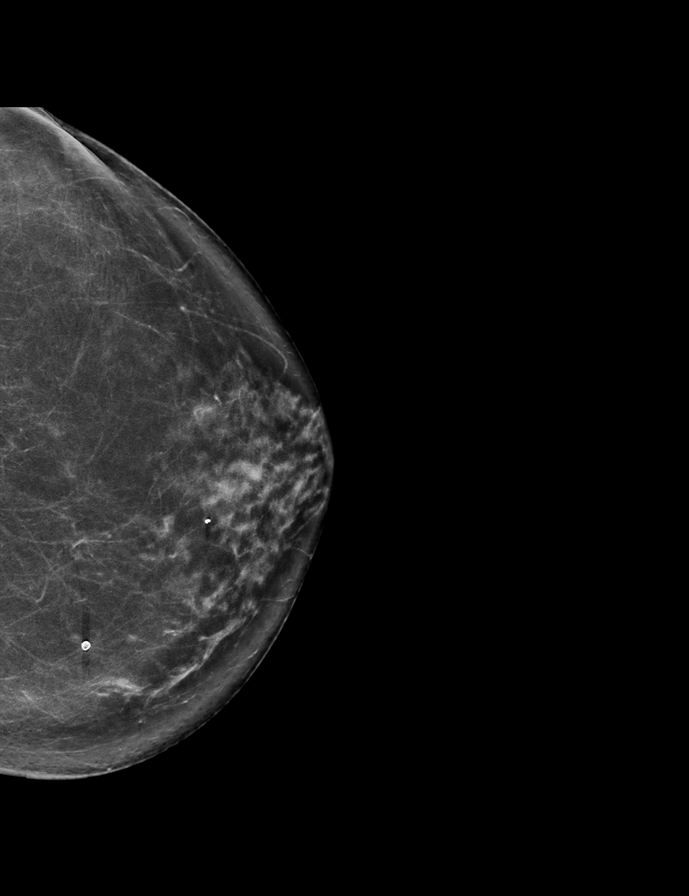

[R CC synth-2D]
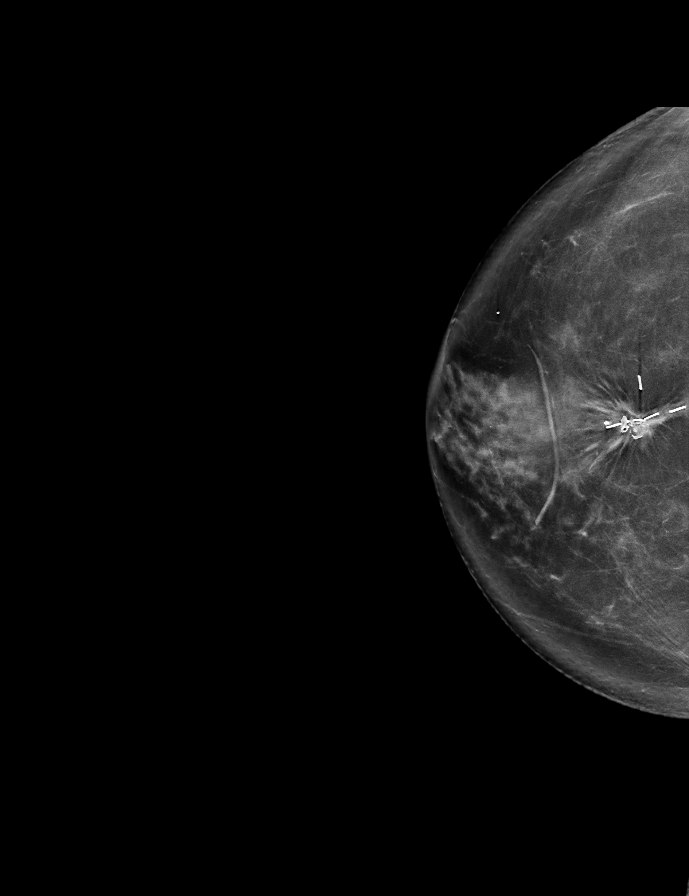

[R CC tomo · 2 of 73 frames shown]
[frame 24/73]
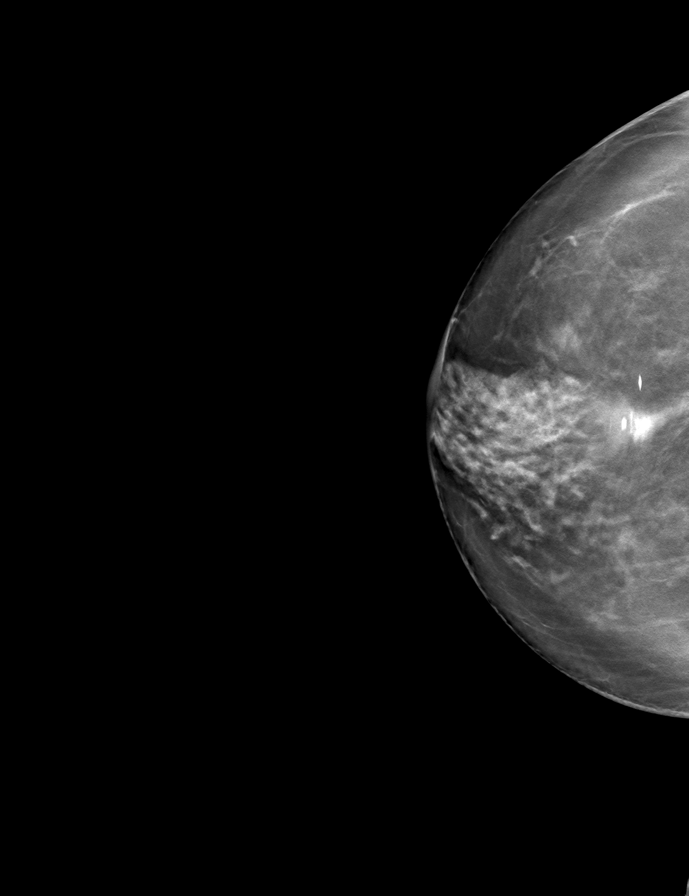
[frame 37/73]
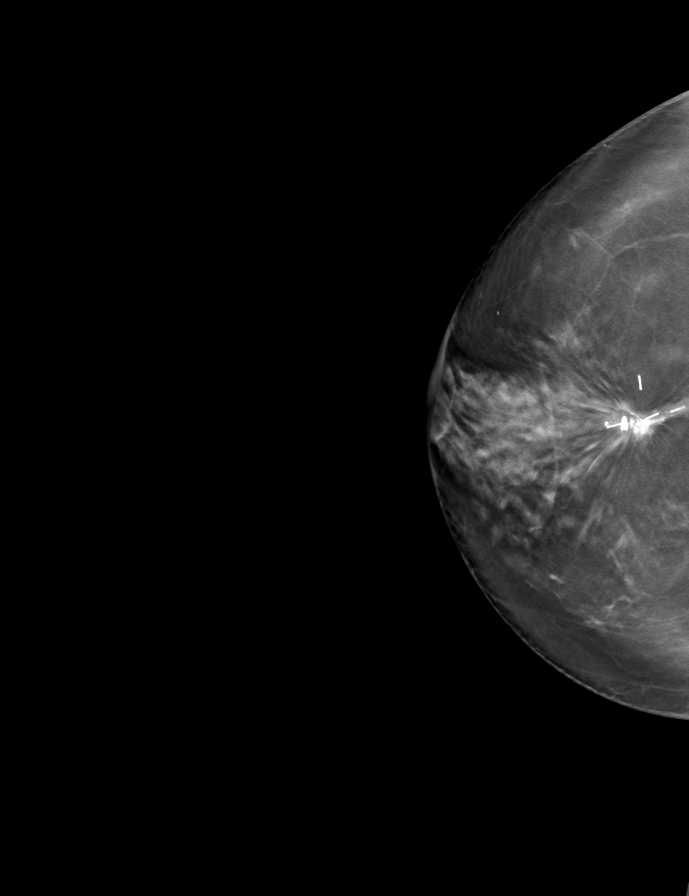

[R MLO tomo · tomo slice 35/70.0]
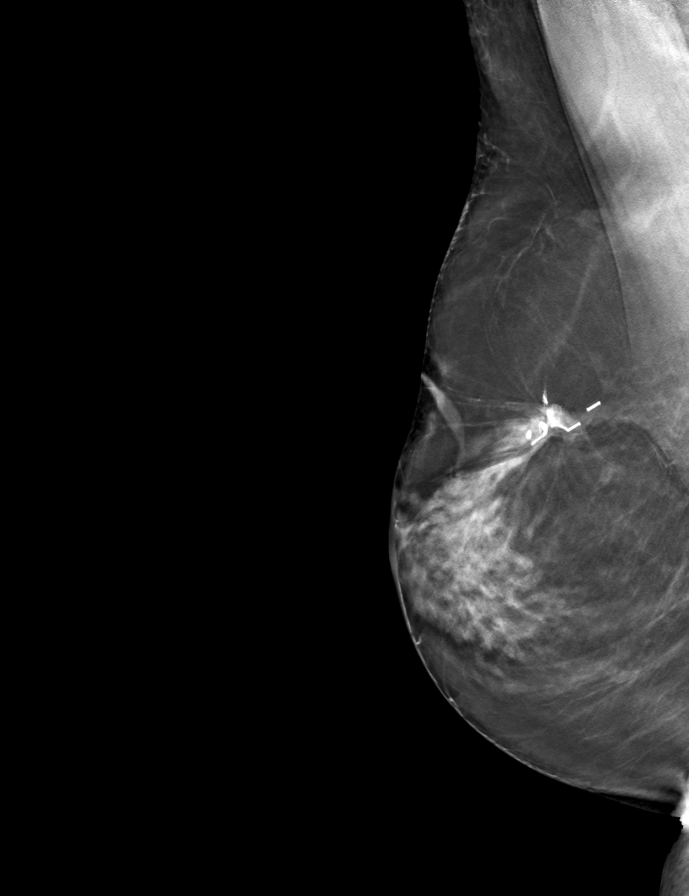

[L MLO tomo · tomo slice 37/72.0]
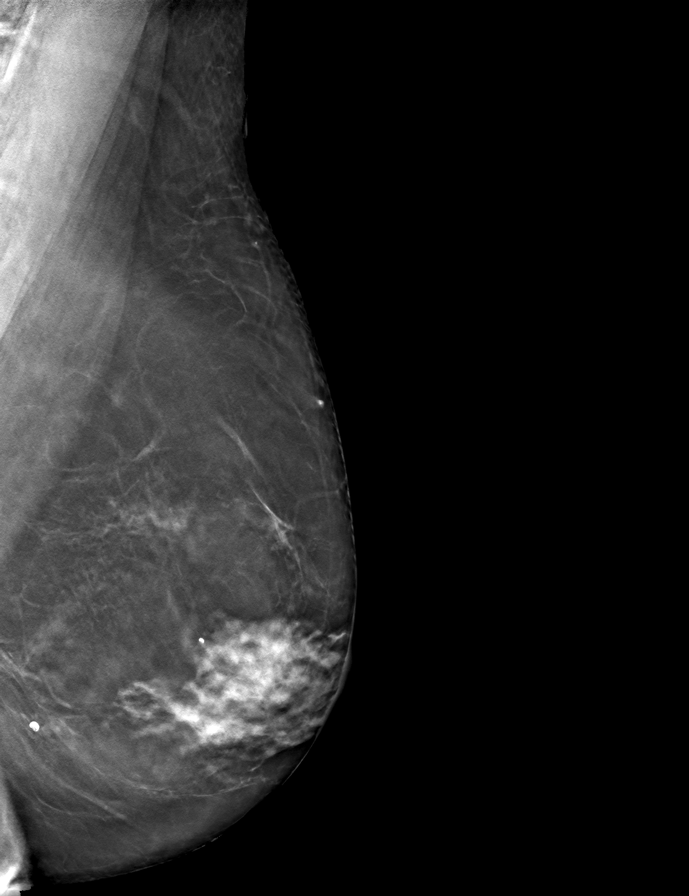

[L CC tomo · tomo slice 37/73.0]
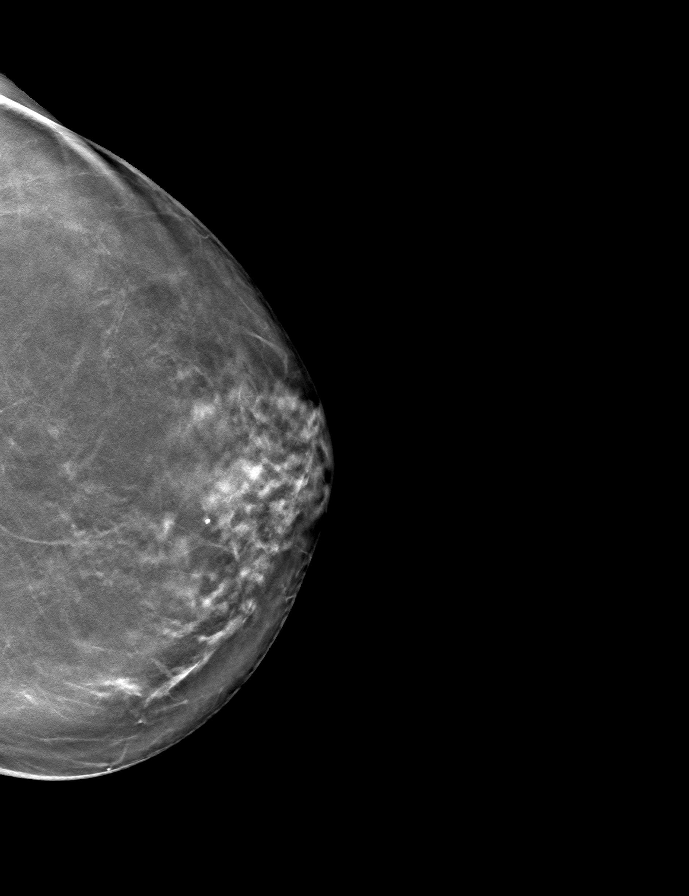

[9 of 24 positions shown; findings below may reference images not displayed]

ACR Breast Density Category b: There are scattered areas of
fibroglandular density.
FINDINGS: There are no findings suspicious for malignancy. RIGHT breast
surgical changes again noted.
IMPRESSION: No mammographic evidence of malignancy. A result letter of this
screening mammogram will be mailed directly to the patient.

RECOMMENDATION:
Screening mammogram in one year. (Code:SW-P-DKN)

BI-RADS CATEGORY  2: Benign.

## 2022-06-25 NOTE — Progress Notes (Unsigned)
Cardiology Office Note  Date:  06/26/2022   ID:  Ana Craig, DOB [birth year], MRN [id number]  PCP:  Alm Bustard, NP   Chief Complaint  Patient presents with   New Patient (Initial Visit)    Ref by [name], NP to establish care for A-Fib; former Dr. Darrold Junker patient. Medications reviewed by the patient verbally.     HPI:  [name] is a 72 year old woman with past medical history of Breast cancer PAF Who presents by referral from [facility] fields for atrial fibrillation Previously seen by Gavin Potters  History of paroxysmal atrial fibrillation, first noted February 2023 Holter monitor was performed 04/2021 which revealed atrial fibrillation burden of 0.12% with longest episode 3 minutes and 21 seconds.  started on Xarelto for stroke prevention and metoprolol succinate for rate and rhythm control.    active, goes to the gym 3 days a week, walks on a treadmill.   stress echocardiogram 04/07/2021, exercised 3 minutes and 32 seconds on the Bruce protocol without chest pain or ischemic ECG changes.   2D echocardiogram  revealed normal left ventricular function with LVEF greater than 55%. At peak exercise there was appropriate augmentation of all myocardial segments, without evidence for scar or ischemia.  No complete echocardiogram has been performed  Rare palpitations, uses her watch to monitor her pulse rate Reports having some cramping in her legs, concerned about circulation Reports having annual life screening, was told everything was normal in the past  EKG personally reviewed by myself on todays visit Normal sinus rhythm rate 68 bpm no significant ST-T wave changes   PMH:   has a past medical history of Arthritis, Basal cell carcinoma (02/06/2014), Breast cancer (HCC) (12/25/12), Hypothyroidism, Osteoporosis, Personal history of radiation therapy (2014), PONV (postoperative nausea and vomiting), and Wears glasses.  PSH:    Past Surgical History:  Procedure  Laterality Date   ABDOMINAL HYSTERECTOMY  1998   APPENDECTOMY  2002   lap   BREAST BIOPSY Right 12/13/2012   BREAST LUMPECTOMY Right 2014   BREAST LUMPECTOMY WITH NEEDLE LOCALIZATION Right 12/25/2012   Procedure: BREAST LUMPECTOMY WITH NEEDLE LOCALIZATION;  Surgeon: Mariella Saa, MD;  Location: [facility];  Service: General;  Laterality: Right;   COLONOSCOPY     COLONOSCOPY WITH PROPOFOL N/A 02/29/2016   Procedure: COLONOSCOPY WITH PROPOFOL;  Surgeon: Charolett Bumpers, MD;  Location: [facility];  Service: Endoscopy;  Laterality: N/A;   OPEN REDUCTION INTERNAL FIXATION (ORIF) DISTAL RADIAL FRACTURE Right 04/2016   [place]   TONSILLECTOMY      Current Outpatient Medications  Medication Sig Dispense Refill   Ascorbic Acid (VITAMIN C IMMUNE HEALTH PO) Take by mouth daily.     Calcium Carb-Cholecalciferol (CALCIUM-VITAMIN D) 500-200 MG-UNIT tablet Take 1 tablet by mouth daily.     latanoprost (XALATAN) 0.005 % ophthalmic solution Place 1 drop into the right eye at bedtime.     metoprolol succinate (TOPROL-XL) 25 MG 24 hr tablet Take 1 tablet by mouth daily.     Multiple Vitamin (MULTI-VITAMIN) tablet Take 1 tablet by mouth daily.     SYNTHROID 75 MCG tablet TAKE 1 TABLET BY MOUTH ONCE DAILY 90 tablet 3   XARELTO 20 MG TABS tablet Take 20 mg by mouth daily with supper.     No current facility-administered medications for this visit.    Allergies:   Penicillins and Codeine   Social History:  The patient  reports that she has never smoked. She has never used  smokeless tobacco. She reports that she does not drink alcohol and does not use drugs.   Family History:   family history includes COPD in her mother.    Review of Systems: Review of Systems  Constitutional: Negative.   HENT: Negative.    Respiratory: Negative.    Cardiovascular: Negative.   Gastrointestinal: Negative.   Musculoskeletal: Negative.   Neurological: Negative.   Psychiatric/Behavioral:  Negative.    All other systems reviewed and are negative.   PHYSICAL EXAM: VS:  BP 118/70 (BP Location: Left Arm, Patient Position: Sitting, Cuff Size: Normal)   Pulse 68   Ht 5\' 6"  (1.676 m)   Wt 141 lb 4 oz (64.1 kg)   SpO2 98%   BMI 22.80 kg/m  , BMI Body mass index is 22.8 kg/m. GEN: Well nourished, well developed, in no acute distress HEENT: normal Neck: no JVD, carotid bruits, or masses Cardiac: RRR; no murmurs, rubs, or gallops,no edema  Respiratory:  clear to auscultation bilaterally, normal work of breathing GI: soft, nontender, nondistended, + BS MS: no deformity or atrophy Skin: warm and dry, no rash Neuro:  Strength and sensation are intact Psych: euthymic mood, full affect   Recent Labs: No results found for requested labs within last 365 days.    Lipid Panel Lab Results  Component Value Date   CHOL 200 07/08/2019   HDL 70 07/08/2019   LDLCALC 114 07/08/2019   TRIG 76 07/08/2019      Wt Readings from Last 3 Encounters:  06/26/22 141 lb 4 oz (64.1 kg)  02/11/20 132 lb 6.4 oz (60.1 kg)  08/07/19 129 lb 3.2 oz (58.6 kg)     ASSESSMENT AND PLAN:  Problem List Items Addressed This Visit     History of breast cancer   Other Visit Diagnoses     Paroxysmal atrial fibrillation (HCC)    -  Primary   Relevant Medications   XARELTO 20 MG TABS tablet   metoprolol succinate (TOPROL-XL) 25 MG 24 hr tablet      Paroxysmal atrial fibrillation In general has been relatively well-controlled on metoprolol succinate 25 daily Takes Xarelto 20 daily, stroke prevention Discussed using her watch tracking pulse to determine if she is out of rhythm For tachypalpitations concerning for atrial fibrillation recommended she could take extra half or whole metoprolol succinate Medications have been refilled Stress test and echocardiogram from 2023 reviewed  Muscle cramping Good circulation in her legs, less likely claudication She will check ABIs done through life  screening Recommend she stay hydrated, add electrolytes  Hypothyroidism Managed by primary care, on thyroid supplement     Total encounter time more than 60 minutes  Greater than 50% was spent in counseling and coordination of care with the patient    Signed, Dossie Arbour, M.D., Ph.D. [facility] [place], [place] [phone]

## 2022-06-26 ENCOUNTER — Encounter: Payer: Self-pay | Admitting: Cardiovascular Disease

## 2022-06-26 ENCOUNTER — Ambulatory Visit: Payer: Medicare Other | Attending: Cardiovascular Disease | Admitting: Cardiovascular Disease

## 2022-06-26 VITALS — BP 118/70 | HR 68 | Ht 66.0 in | Wt 141.2 lb

## 2022-06-26 DIAGNOSIS — I480 Paroxysmal atrial fibrillation: Secondary | ICD-10-CM | POA: Insufficient documentation

## 2022-06-26 DIAGNOSIS — Z853 Personal history of malignant neoplasm of breast: Secondary | ICD-10-CM | POA: Diagnosis present

## 2022-06-26 MED ORDER — XARELTO 20 MG PO TABS: 20.0000 mg | ORAL_TABLET | Freq: Every day | ORAL | 3 refills | Status: DC

## 2022-06-26 MED ORDER — METOPROLOL SUCCINATE ER 25 MG PO TB24: 25.0000 mg | ORAL_TABLET | Freq: Every day | ORAL | 3 refills | Status: DC

## 2022-06-26 NOTE — Patient Instructions (Addendum)
Check your ABIs from lifeline,  If normal you probably don't need repeat   Medication Instructions:  No changes  If you need a refill on your cardiac medications before your next appointment, please call your pharmacy.   Lab work: No new labs needed  Testing/Procedures: As needed  Follow-Up: At [facility], you and your health needs are our priority.  As part of our continuing mission to provide you with exceptional heart care, we have created designated Provider Care Teams.  These Care Teams include your primary Cardiologist (physician) and Advanced Practice Providers (APPs -  Physician Assistants and Nurse Practitioners) who all work together to provide you with the care you need, when you need it.  You will need a follow up appointment in 12 months  Providers on your designated Care Team:   Nicolasa Ducking, NP Eula Listen, PA-C Cadence Fransico Michael, [place]  COVID-19 Vaccine Information can be found at: [email or web address] For questions related to vaccine distribution or appointments, please email [email or web address] or call [phone].

## 2022-12-15 ENCOUNTER — Other Ambulatory Visit: Payer: Self-pay | Admitting: Family Medicine

## 2022-12-15 DIAGNOSIS — Z1231 Encounter for screening mammogram for malignant neoplasm of breast: Secondary | ICD-10-CM

## 2023-01-19 ENCOUNTER — Ambulatory Visit
Admission: RE | Admit: 2023-01-19 | Discharge: 2023-01-19 | Disposition: A | Discharge status: Home / Self Care | Payer: Medicare Other | Source: Ambulatory Visit | Attending: Family Medicine | Admitting: Family Medicine

## 2023-01-19 DIAGNOSIS — Z1231 Encounter for screening mammogram for malignant neoplasm of breast: Secondary | ICD-10-CM

## 2023-07-13 ENCOUNTER — Other Ambulatory Visit: Payer: Self-pay | Admitting: Cardiovascular Disease

## 2023-07-13 DIAGNOSIS — I480 Paroxysmal atrial fibrillation: Secondary | ICD-10-CM

## 2023-07-13 NOTE — Telephone Encounter (Signed)
 Xarelto  20mg  refill request received. Pt is 74 years old, weight-64.1kg, Crea-0.90 on 04/10/23 via [facility] Everywhere from [place], last seen by Dr. Gollan on 06/26/22-needs an appt, will send a message to Schedulers, Diagnosis-Afib, CrCl- 58.86 mL/min; Dose is appropriate based on dosing criteria.   Sent message to Schedulers regarding pt needing an appointment.

## 2023-07-16 ENCOUNTER — Telehealth: Payer: Self-pay | Admitting: Cardiovascular Disease

## 2023-07-16 ENCOUNTER — Other Ambulatory Visit: Payer: Self-pay | Admitting: *Deleted

## 2023-07-16 DIAGNOSIS — I480 Paroxysmal atrial fibrillation: Secondary | ICD-10-CM

## 2023-07-16 MED ORDER — XARELTO 20 MG PO TABS: 20.0000 mg | ORAL_TABLET | Freq: Every day | ORAL | 5 refills | Status: DC

## 2023-07-16 NOTE — Telephone Encounter (Signed)
 Xarelto  20mg  refill request received. Pt is 73 years old, weight-64.1kg, Crea-0.90 on 04/10/23 via [facility] Everywhere from [place], last seen by Dr. Jerelene Monday on 06/26/22 and pending appt on 09/10/23, Diagnosis-Afib, CrCl-58.86; Dose is appropriate based on dosing criteria. Will send in refill to requested pharmacy.

## 2023-07-16 NOTE — Telephone Encounter (Signed)
 LVM to schedule f/u appt

## 2023-07-16 NOTE — Telephone Encounter (Signed)
-----   Message from Nurse Candance H sent at 07/13/2023  1:36 PM EDT ----- Regarding: Appointment Good afternoon,   This pt is overdue to see her Cardiologist. She was last on 06/26/22. She is pending a refill request and needs an appointment. Can you please reach out to her for an appointment?  Thanks in advance, [address]

## 2023-08-07 ENCOUNTER — Other Ambulatory Visit: Payer: Self-pay | Admitting: Cardiovascular Disease

## 2023-08-07 DIAGNOSIS — I480 Paroxysmal atrial fibrillation: Secondary | ICD-10-CM

## 2023-08-07 NOTE — Telephone Encounter (Signed)
 Per pt this should be sent to [facility] in [place]  [address]  [phone]

## 2023-08-07 NOTE — Telephone Encounter (Signed)
 Prescription refill request for Xarelto  received.  Indication: PAF Last office visit: 06/26/22  ONEIDA Lunger MD (appt 09/10/23) Weight: 64.1kg Age: 73 Scr: 0.9 on 04/10/23  Epic CrCl: 58.86  Based on above findings Xarelto  20mg  daily is the appropriate dose.  Refill approved.

## 2023-09-06 ENCOUNTER — Other Ambulatory Visit: Payer: Self-pay | Admitting: Cardiovascular Disease

## 2023-09-07 NOTE — Progress Notes (Signed)
 Cardiology Office Note  Date:  09/10/2023   ID:  Ana Craig, DOB 10-04-1950, MRN [id number]  PCP:  Harvey Gaetana LITTIE, NP   Chief Complaint  Patient presents with   Follow-up     C/o-Atrial fibrillation, patient denies any chest pain, and no shortness of breath today.    HPI:  [name] is a 73 year old woman with past medical history of Breast cancer PAF, first noted February 2023 Who presents for follow-up of her atrial fibrillation  Last seen by myself in clinic May 2024  Recent events discussed Covid 2/25,  SOB while recovering from the virus, lungs felt tight Feels she is getting back to her baseline  walking up and down the road for exercise Went back to the gym, walked 4 miles, breathing felt better  Occasional palpitations if she forgets to take her metoprolol  succinate 25 daily  Denies longer episodes of tachycardia concerning for prolonged atrial fibrillation  Has a watch that can tell heart rate but does not track A-fib spells  History of paroxysmal atrial fibrillation, first noted February 2023 Holter monitor was performed 04/2021 which revealed atrial fibrillation burden of 0.12% with longest episode 3 minutes and 21 seconds.  started on Xarelto  for stroke prevention and metoprolol  succinate for rate and rhythm control.   stress echocardiogram 04/07/2021, exercised 3 minutes and 32 seconds on the Bruce protocol without chest pain or ischemic ECG changes.   2D echocardiogram  revealed normal left ventricular function with LVEF greater than 55%. At peak exercise there was appropriate augmentation of all myocardial segments, without evidence for scar or ischemia.  No complete echocardiogram has been performed  Labs reviewed: Total chol 207, LDL 119 A1C 5.5  EKG personally reviewed by myself on todays visit EKG Interpretation Date/Time:  Monday September 10 2023 12:03:36 EDT Ventricular Rate:  70 PR Interval:  178 QRS Duration:  68 QT Interval:  402 QTC  Calculation: 434 R Axis:   62  Text Interpretation: Normal sinus rhythm When compared with ECG of 29-May-2000 17:39, Questionable change in initial forces of Septal leads Confirmed by Perla Lye [phone]) on 09/10/2023 12:21:27 PM    PMH:   has a past medical history of Arthritis, Basal cell carcinoma (02/06/2014), Breast cancer (HCC) (12/25/12), Hypothyroidism, Osteoporosis, Personal history of radiation therapy (2014), PONV (postoperative nausea and vomiting), and Wears glasses.  PSH:    Past Surgical History:  Procedure Laterality Date   ABDOMINAL HYSTERECTOMY  1998   APPENDECTOMY  2002   lap   BREAST BIOPSY Right 12/13/2012   BREAST LUMPECTOMY Right 2014   BREAST LUMPECTOMY WITH NEEDLE LOCALIZATION Right 12/25/2012   Procedure: BREAST LUMPECTOMY WITH NEEDLE LOCALIZATION;  Surgeon: Morene ONEIDA Olives, MD;  Location: [facility];  Service: General;  Laterality: Right;   COLONOSCOPY     COLONOSCOPY WITH PROPOFOL  N/A 02/29/2016   Procedure: COLONOSCOPY WITH PROPOFOL ;  Surgeon: Gladis MARLA Louder, MD;  Location: [facility];  Service: Endoscopy;  Laterality: N/A;   OPEN REDUCTION INTERNAL FIXATION (ORIF) DISTAL RADIAL FRACTURE Right 04/2016   [place]   TONSILLECTOMY      Current Outpatient Medications  Medication Sig Dispense Refill   Ascorbic Acid (VITAMIN C IMMUNE HEALTH PO) Take by mouth daily.     Calcium Carb-Cholecalciferol (CALCIUM-VITAMIN D ) 500-200 MG-UNIT tablet Take 1 tablet by mouth daily.     latanoprost (XALATAN) 0.005 % ophthalmic solution Place 1 drop into the right eye at bedtime.     metoprolol  succinate (TOPROL -XL) 25 MG  24 hr tablet Take 1 tablet (25 mg total) by mouth daily. 90 tablet 3   Multiple Vitamin (MULTI-VITAMIN) tablet Take 1 tablet by mouth daily.     SYNTHROID  75 MCG tablet TAKE 1 TABLET BY MOUTH ONCE DAILY 90 tablet 3   XARELTO  20 MG TABS tablet Take 1 tablet (20 mg total) by mouth daily with supper. 30 tablet 5   No current  facility-administered medications for this visit.    Allergies:   Penicillins and Codeine   Social History:  The patient  reports that she has never smoked. She has never used smokeless tobacco. She reports that she does not drink alcohol and does not use drugs.   Family History:   family history includes COPD in her mother.    Review of Systems: Review of Systems  Constitutional: Negative.   HENT: Negative.    Respiratory: Negative.    Cardiovascular: Negative.   Gastrointestinal: Negative.   Musculoskeletal: Negative.   Neurological: Negative.   Psychiatric/Behavioral: Negative.    All other systems reviewed and are negative.   PHYSICAL EXAM: VS:  BP 118/78 (BP Location: Left Arm, Patient Position: Sitting, Cuff Size: Normal)   Pulse 70   Ht 5' 6 (1.676 m)   Wt 139 lb (63 kg)   SpO2 98%   BMI 22.44 kg/m  , BMI Body mass index is 22.44 kg/m. Constitutional:  oriented to person, place, and time. No distress.  HENT:  Head: Grossly normal Eyes:  no discharge. No scleral icterus.  Neck: No JVD, no carotid bruits  Cardiovascular: Regular rate and rhythm, no murmurs appreciated Pulmonary/Chest: Clear to auscultation bilaterally, no wheezes or rales Abdominal: Soft.  no distension.  no tenderness.  Musculoskeletal: Normal range of motion Neurological:  normal muscle tone. Coordination normal. No atrophy Skin: Skin warm and dry Psychiatric: normal affect, pleasant  Recent Labs: No results found for requested labs within last 365 days.    Lipid Panel Lab Results  Component Value Date   CHOL 200 07/08/2019   HDL 70 07/08/2019   LDLCALC 114 07/08/2019   TRIG 76 07/08/2019    Wt Readings from Last 3 Encounters:  09/10/23 139 lb (63 kg)  06/26/22 141 lb 4 oz (64.1 kg)  02/11/20 132 lb 6.4 oz (60.1 kg)     ASSESSMENT AND PLAN:  Problem List Items Addressed This Visit     GERD (gastroesophageal reflux disease)   Elevated glucose   Other Visit Diagnoses        Paroxysmal atrial fibrillation (HCC)    -  Primary   Relevant Orders   EKG 12-Lead (Completed)      Paroxysmal atrial fibrillation Recommend she continue metoprolol  succinate 25 daily Relatively well-controlled rhythm Suggest she take extra metoprolol  succinate 25 for breakthrough prolonged tachycardia concerning for atrial fibrillation continue Xarelto  20 daily, for stroke prevention Stress test and echocardiogram completed 2023  Muscle cramping Prior history,  good circulation in her legs, less likely claudication Exercising without significant restrictions  Hypothyroidism Managed by primary care, on thyroid  supplement  Hyperlipidemia Total cholesterol greater than 200 No other significant risk factors, non-smoker, no diabetes Prefers not to be on medication  Signed, Velinda Lunger, M.D., Ph.D. [facility] [place], [place] [phone]

## 2023-09-10 ENCOUNTER — Encounter: Payer: Self-pay | Admitting: Cardiovascular Disease

## 2023-09-10 ENCOUNTER — Ambulatory Visit: Attending: Cardiovascular Disease | Admitting: Cardiovascular Disease

## 2023-09-10 VITALS — BP 118/78 | HR 70 | Ht 66.0 in | Wt 139.0 lb

## 2023-09-10 DIAGNOSIS — R7309 Other abnormal glucose: Secondary | ICD-10-CM | POA: Insufficient documentation

## 2023-09-10 DIAGNOSIS — I480 Paroxysmal atrial fibrillation: Secondary | ICD-10-CM | POA: Diagnosis present

## 2023-09-10 DIAGNOSIS — K219 Gastro-esophageal reflux disease without esophagitis: Secondary | ICD-10-CM | POA: Diagnosis present

## 2023-09-10 MED ORDER — XARELTO 20 MG PO TABS: 20.0000 mg | ORAL_TABLET | Freq: Every day | ORAL | 11 refills | Status: DC

## 2023-09-10 MED ORDER — METOPROLOL SUCCINATE ER 25 MG PO TB24: 25.0000 mg | ORAL_TABLET | Freq: Every day | ORAL | 4 refills | Status: AC

## 2023-09-10 NOTE — Patient Instructions (Addendum)
 Medication Instructions:  No changes  Take extra metoprolol  as needed for break-through tachycardia  If you need a refill on your cardiac medications before your next appointment, please call your pharmacy.   Lab work: No new labs needed  Testing/Procedures: No new testing needed  Follow-Up: At [facility], you and your health needs are our priority.  As part of our continuing mission to provide you with exceptional heart care, we have created designated Provider Care Teams.  These Care Teams include your primary Cardiologist (physician) and Advanced Practice Providers (APPs -  Physician Assistants and Nurse Practitioners) who all work together to provide you with the care you need, when you need it.  You will need a follow up appointment in 12 months  Providers on your designated Care Team:   Ana Meager, NP Ana Bring, PA-C Ana Craig, [place]  COVID-19 Vaccine Information can be found at: [email or web address] For questions related to vaccine distribution or appointments, please email [email or web address] or call [phone].

## 2023-11-16 ENCOUNTER — Encounter: Payer: Self-pay | Admitting: Cardiovascular Disease

## 2023-11-24 ENCOUNTER — Other Ambulatory Visit: Payer: Self-pay | Admitting: Cardiovascular Disease

## 2023-11-24 MED ORDER — APIXABAN 5 MG PO TABS: 5.0000 mg | ORAL_TABLET | Freq: Two times a day (BID) | ORAL | 3 refills | Status: DC

## 2023-11-26 ENCOUNTER — Other Ambulatory Visit: Payer: Self-pay | Admitting: Emergency Medicine

## 2023-11-26 MED ORDER — APIXABAN 5 MG PO TABS: 5.0000 mg | ORAL_TABLET | Freq: Two times a day (BID) | ORAL | 3 refills | Status: AC

## 2023-12-03 ENCOUNTER — Other Ambulatory Visit: Payer: Self-pay | Admitting: Family Medicine

## 2023-12-03 DIAGNOSIS — Z1231 Encounter for screening mammogram for malignant neoplasm of breast: Secondary | ICD-10-CM

## 2024-01-21 ENCOUNTER — Ambulatory Visit

## 2024-01-29 ENCOUNTER — Ambulatory Visit
Admission: RE | Admit: 2024-01-29 | Discharge: 2024-01-29 | Disposition: A | Discharge status: Home / Self Care | Source: Ambulatory Visit | Attending: Family Medicine | Admitting: Family Medicine

## 2024-01-29 DIAGNOSIS — Z1231 Encounter for screening mammogram for malignant neoplasm of breast: Secondary | ICD-10-CM

## 2024-05-27 ENCOUNTER — Ambulatory Visit: Admitting: Cardiovascular Disease
